# Patient Record
Sex: Male | Born: 1975 | Race: Black or African American | Hispanic: No | Marital: Married | Smoking: Former smoker
Health system: Southern US, Community
[De-identification: ages and names within clinical notes are randomized; demographics above are authoritative.]

## PROBLEM LIST (undated history)

## (undated) DIAGNOSIS — Z91030 Bee allergy status: Secondary | ICD-10-CM

## (undated) HISTORY — PX: HERNIA REPAIR: SHX51

## (undated) HISTORY — PX: ANTERIOR CRUCIATE LIGAMENT REPAIR: SHX115

---

## 1998-01-09 ENCOUNTER — Emergency Department (HOSPITAL_COMMUNITY): Admission: EM | Admit: 1998-01-09 | Discharge: 1998-01-09 | Payer: Self-pay | Admitting: Emergency Medicine

## 2004-08-03 ENCOUNTER — Emergency Department (HOSPITAL_COMMUNITY): Admission: EM | Admit: 2004-08-03 | Discharge: 2004-08-03 | Payer: Self-pay | Admitting: Family Medicine

## 2004-10-12 ENCOUNTER — Emergency Department (HOSPITAL_COMMUNITY): Admission: EM | Admit: 2004-10-12 | Discharge: 2004-10-12 | Payer: Self-pay | Admitting: Family Medicine

## 2004-10-18 ENCOUNTER — Emergency Department (HOSPITAL_COMMUNITY): Admission: EM | Admit: 2004-10-18 | Discharge: 2004-10-18 | Payer: Self-pay | Admitting: Family Medicine

## 2004-12-17 ENCOUNTER — Emergency Department (HOSPITAL_COMMUNITY): Admission: EM | Admit: 2004-12-17 | Discharge: 2004-12-17 | Payer: Self-pay | Admitting: Family Medicine

## 2004-12-20 ENCOUNTER — Emergency Department (HOSPITAL_COMMUNITY): Admission: EM | Admit: 2004-12-20 | Discharge: 2004-12-20 | Payer: Self-pay | Admitting: Family Medicine

## 2004-12-22 ENCOUNTER — Emergency Department (HOSPITAL_COMMUNITY): Admission: EM | Admit: 2004-12-22 | Discharge: 2004-12-22 | Payer: Self-pay | Admitting: Family Medicine

## 2005-02-23 ENCOUNTER — Emergency Department (HOSPITAL_COMMUNITY): Admission: EM | Admit: 2005-02-23 | Discharge: 2005-02-23 | Payer: Self-pay | Admitting: Family Medicine

## 2005-08-22 ENCOUNTER — Emergency Department (HOSPITAL_COMMUNITY): Admission: EM | Admit: 2005-08-22 | Discharge: 2005-08-22 | Payer: Self-pay | Admitting: Emergency Medicine

## 2007-08-09 ENCOUNTER — Emergency Department (HOSPITAL_COMMUNITY): Admission: EM | Admit: 2007-08-09 | Discharge: 2007-08-09 | Payer: Self-pay | Admitting: Emergency Medicine

## 2009-05-03 ENCOUNTER — Emergency Department (HOSPITAL_COMMUNITY): Admission: EM | Admit: 2009-05-03 | Discharge: 2009-05-03 | Payer: Self-pay | Admitting: Emergency Medicine

## 2009-08-31 ENCOUNTER — Emergency Department (HOSPITAL_COMMUNITY): Admission: EM | Admit: 2009-08-31 | Discharge: 2009-08-31 | Payer: Self-pay | Admitting: Emergency Medicine

## 2009-09-04 ENCOUNTER — Emergency Department (HOSPITAL_COMMUNITY): Admission: EM | Admit: 2009-09-04 | Discharge: 2009-09-04 | Payer: Self-pay | Admitting: Emergency Medicine

## 2010-03-20 ENCOUNTER — Inpatient Hospital Stay (HOSPITAL_COMMUNITY): Admission: EM | Admit: 2010-03-20 | Discharge: 2010-03-21 | Payer: Self-pay | Admitting: Emergency Medicine

## 2010-03-20 ENCOUNTER — Ambulatory Visit: Payer: Self-pay | Admitting: Pulmonary Disease

## 2010-04-12 ENCOUNTER — Emergency Department (HOSPITAL_COMMUNITY): Admission: EM | Admit: 2010-04-12 | Discharge: 2010-04-12 | Payer: Self-pay | Admitting: Emergency Medicine

## 2010-04-15 ENCOUNTER — Ambulatory Visit: Payer: Self-pay | Admitting: Internal Medicine

## 2010-04-15 DIAGNOSIS — T6391XA Toxic effect of contact with unspecified venomous animal, accidental (unintentional), initial encounter: Secondary | ICD-10-CM | POA: Insufficient documentation

## 2010-05-18 ENCOUNTER — Ambulatory Visit: Payer: Self-pay | Admitting: Internal Medicine

## 2010-10-26 NOTE — Assessment & Plan Note (Signed)
Summary: rov 1 month///kp   Primary Provider/Referring Provider:  none  CC:  1 month followup.  Pt c/o itching from previous yellow jacket stings. No other complaints today.Marland Kitchen  History of Present Illness:  History of Present Illness: April 15, 2010- 34 yoM referred by Dr Sung Amabile for allergy evaluation after insect sting. He has had occasional sting in the past withonly local reaction, and none i recent years. On 03/20/10 he ran his lawn mower over a [employer] and had at least 6 stings on his lower legs. Within 10 minutes legs were itching. he took a shower but itching progressed generally. On way to ER he passed out . He was intubated briefly at ER and treated with steroids, epinephrine and antihistamines. he cleared quickly and was discharged next day. There is hx of mild seasonal rhinitis. As a child he tended to get hives in Spring and Fall. He was also hospitalized as a young child with asthma, not needing intubation.   May 18, 2010- Insect sting allergy  Was followed by a yellow jacket but not stung again since last here. Carries epipen- never needed to use.  Hymenoptera panel IgE levels are all high.This was reviewed. We discussed prevention of exposure, pretreatment with antihistamines before going to areas like yardwork or picnics with potential exposure, and epipen. He carries his epipen. We discussed skin testing for stinging insect sensitivity and availability of venom hyposensitization, which I don't offer here. He feels quite well today.   Preventive Screening-Counseling & Management  Alcohol-Tobacco     Smoking Status: quit < 6 months     Smoking Cessation Counseling: yes     Smoke Cessation Stage: quit     Packs/Day: <0.25     Year Quit: 2011     Tobacco Counseling: to remain off tobacco products  Current Medications (verified): 1)  Epipen 0.3 Mg/0.76ml Devi (Epinephrine) .... For Severe Allergic Reaction  Allergies (verified): No Known Drug Allergies  Past  History:  Past Medical History: Last updated: 04/15/2010 stinging insect hypersensitivity- yellow jacket- 2011  Past Surgical History: Last updated: 04/15/2010 ACL knee surgery - 2002  Family History: Last updated: 04/15/2010 No significant allergy  Social History: Last updated: 04/15/2010 Married with children Exsmoker: 01/2010 1 PPW x 17 years Use of Pot: quit 01/2010 Works as IT sales professional  Risk Factors: Smoking Status: quit < 6 months (05/18/2010) Packs/Day: <0.25 (05/18/2010)  Social History: Smoking Status:  quit < 6 months Packs/Day:  <0.25  Review of Systems      See HPI  The patient denies anorexia, fever, weight loss, weight gain, vision loss, decreased hearing, hoarseness, chest pain, syncope, dyspnea on exertion, peripheral edema, prolonged cough, headaches, hemoptysis, abdominal pain, melena, hematochezia, and severe indigestion/heartburn.    Vital Signs:  Patient profile:   35 year old male Weight:      143 pounds O2 Sat:      98 % on Room air Pulse rate:   78 / minute BP sitting:   130 / 78  (left arm)  Vitals Entered By: Vernie Murders (May 18, 2010 4:03 PM)  O2 Flow:  Room air  Physical Exam  Additional Exam:  Not examined today. He looks well and comfortable. 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 I am recommending he see Dr Lucie Leather to discuss  venom testing and hyposensitization, which I don't offer here because of lack of volume. He is faithfull about carrying his Epipen. He knows measures to avoid exposure, and understands to pretreat with an antihistamine before potential exposure.  Other Orders: Est. Patient Level III (02725)  Patient Instructions: 1)  Please schedule a follow-up appointment as needed. 2)  i  would recommend that you see Dr Laurette Schimke to discuss insect venom testing. 3)  Avoid wearing colognes and scents outside, avoid light/ pastel colored clothing outside, and consider pretreating yourself with an antihistamine like allegra or claritin before outside activity like yardwork or picnics. Carry your Epipen.

## 2010-10-26 NOTE — Assessment & Plan Note (Signed)
Summary: allergies - stung by yellow jackets/apc   Vital Signs:  Patient profile:   35 year old male Weight:      141.38 pounds O2 Sat:      96 % on Room air Pulse rate:   74 / minute BP sitting:   126 / 84  (right arm) Cuff size:   regular  Vitals Entered By: Reynaldo Minium CMA (April 15, 2010 3:45 PM)  O2 Flow:  Room air CC: Allergy Consult-Dr. Simonds-stung by yellow jackets (multiple times).   Primary Provider/Referring Provider:  none  CC:  Allergy Consult-Dr. Simonds-stung by yellow jackets (multiple times).Marland Kitchen  History of Present Illness: April 15, 2010- 34 yoM referred by Dr Sung Amabile for allergy evaluation after insect sting. He has had occasional sting in the past withonly local reaction, and none i recent years. On 03/20/10 he ran his lawn mower over a [employer] and had at least 6 stings on his lower legs. Within 10 minutes legs were itching. he took a shower but itching progressed generally. On way to ER he passed out . He was intubated briefly at ER and treated with steroids, epinephrine and antihistamines. he cleared quickly and was discharged next day. There is hx of mild seasonal rhinitis. As a child he tended to get hives in Spring and Fall. He was also hospitalized as a Kiersten Coss child with asthma, not needing intubation.   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    Physical Exam  Additional Exam:  General: A/Ox3; pleasant and cooperative, NAD, trim, well- appearing SKIN: no rash, lesions NODES: no lymphadenopathy HEENT: [place]/AT, EOM- WNL, Conjuctivae- clear, PERRLA, TM-WNL, Nose- clear, Throat- clear and wnl, Mallampati  II NECK: Supple w/ fair ROM, JVD- none, normal carotid impulses w/o bruits Thyroid- normal to palpation CHEST: Clear to P&A HEART: RRR, no m/g/r heard ABDOMEN: Soft and nl; nml bowel sounds; no organomegaly or masses noted ZOX:WRUE, nl pulses, no edema  NEURO: Grossly intact to observation      Impression & Recommendations:  Problem # 1:  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 Offered referral for venom testing/ hyposensitization Discussed and trained Epipen, RX Anti histamine- eg allegra 180 on mowing days prophyllactically Hymenoptera allergy panel  Medications Added to Medication List This Visit: 1)  Epipen 0.3 Mg/0.25ml Devi (Epinephrine) .... For severe allergic reaction  Other Orders: Consultation Level III (45409) T- * Misc. Laboratory test [phone])  Patient Instructions: 1)  Please schedule a follow-up appointment in 1 month.  2)  Instruction and script for Epipen 3)  Consider a nonsedating otc antihistamine like Allegra 180 mg, or Loratadine. Take it at least an hour before you go out to mow or do yard work where you might get exposed to stinging insects. 4)  Lab Prescriptions: EPIPEN 0.3  MG/0.3ML DEVI (EPINEPHRINE) For severe allergic reaction  #1 x prn   Entered and Authorized by:   Waymon Budge MD   Signed by:   Waymon Budge MD on 04/15/2010   Method used:   Print then Give to Patient   RxID:   [id number]

## 2010-12-12 LAB — COMPREHENSIVE METABOLIC PANEL
ALT: 20 U/L (ref 0–53)
AST: 33 U/L (ref 0–37)
Albumin: 4 g/dL (ref 3.5–5.2)
Alkaline Phosphatase: 48 U/L (ref 39–117)
BUN: 7 mg/dL (ref 6–23)
CO2: 21 mEq/L (ref 19–32)
Calcium: 8.6 mg/dL (ref 8.4–10.5)
Chloride: 108 mEq/L (ref 96–112)
Creatinine, Ser: 1.36 mg/dL (ref 0.4–1.5)
GFR calc Af Amer: >60 mL/min (ref >60)
GFR calc non Af Amer: 60 mL/min — ABNORMAL LOW (ref >60)
Glucose, Bld: 126 mg/dL — ABNORMAL HIGH (ref 70–99)
Potassium: 3.5 mEq/L (ref 3.5–5.1)
Sodium: 139 mEq/L (ref 135–145)
Total Bilirubin: 1.2 mg/dL (ref 0.3–1.2)
Total Protein: 6.8 g/dL (ref 6.0–8.3)

## 2010-12-12 LAB — APTT: aPTT: 21 seconds — ABNORMAL LOW (ref 24–37)

## 2010-12-12 LAB — PROTIME-INR
INR: 1.07 (ref 0.00–1.49)
Prothrombin Time: 13.8 seconds (ref 11.6–15.2)

## 2010-12-12 LAB — LACTIC ACID, PLASMA: Lactic Acid, Venous: 5.7 mmol/L — ABNORMAL HIGH (ref 0.5–2.2)

## 2010-12-12 LAB — BASIC METABOLIC PANEL
BUN: 8 mg/dL (ref 6–23)
CO2: 26 mEq/L (ref 19–32)
Calcium: 8.7 mg/dL (ref 8.4–10.5)
Chloride: 106 mEq/L (ref 96–112)
Creatinine, Ser: 1.25 mg/dL (ref 0.4–1.5)
GFR calc Af Amer: >60 mL/min (ref >60)
GFR calc non Af Amer: >60 mL/min (ref >60)
Glucose, Bld: 133 mg/dL — ABNORMAL HIGH (ref 70–99)
Potassium: 4.2 mEq/L (ref 3.5–5.1)
Sodium: 139 mEq/L (ref 135–145)

## 2010-12-12 LAB — POCT I-STAT 3, ART BLOOD GAS (G3+)
Acid-Base Excess: 2 mmol/L (ref 0.0–2.0)
Bicarbonate: 26.9 mEq/L — ABNORMAL HIGH (ref 20.0–24.0)
O2 Saturation: 100 %
TCO2: 28 mmol/L (ref 0–100)
pCO2 arterial: 42.4 mmHg (ref 35.0–45.0)
pH, Arterial: 7.41 (ref 7.350–7.450)
pO2, Arterial: 609 mmHg — ABNORMAL HIGH (ref 80.0–100.0)

## 2010-12-12 LAB — DIFFERENTIAL
Basophils Absolute: 0 10*3/uL (ref 0.0–0.1)
Basophils Relative: 0 % (ref 0–1)
Eosinophils Absolute: 0 10*3/uL (ref 0.0–0.7)
Eosinophils Relative: 0 % (ref 0–5)
Lymphocytes Relative: 84 % — ABNORMAL HIGH (ref 12–46)
Lymphs Abs: 4.6 10*3/uL — ABNORMAL HIGH (ref 0.7–4.0)
Monocytes Absolute: 0.2 10*3/uL (ref 0.1–1.0)
Monocytes Relative: 4 % (ref 3–12)
Neutro Abs: 0.6 10*3/uL — ABNORMAL LOW (ref 1.7–7.7)
Neutrophils Relative %: 12 % — ABNORMAL LOW (ref 43–77)

## 2010-12-12 LAB — MRSA PCR SCREENING: MRSA by PCR: NEGATIVE

## 2010-12-12 LAB — PATHOLOGIST SMEAR REVIEW

## 2010-12-12 LAB — CK TOTAL AND CKMB (NOT AT ARMC)
CK, MB: 0.9 ng/mL (ref 0.3–4.0)
Relative Index: 0.5 (ref 0.0–2.5)
Total CK: 173 U/L (ref 7–232)

## 2010-12-12 LAB — CBC
HCT: 45 % (ref 39.0–52.0)
Hemoglobin: 15 g/dL (ref 13.0–17.0)
MCH: 29 pg (ref 26.0–34.0)
MCHC: 33.4 g/dL (ref 30.0–36.0)
MCV: 86.8 fL (ref 78.0–100.0)
Platelets: 234 10*3/uL (ref 150–400)
RBC: 5.18 MIL/uL (ref 4.22–5.81)
RDW: 13.7 % (ref 11.5–15.5)
WBC: 5.4 10*3/uL (ref 4.0–10.5)

## 2010-12-12 LAB — TYPE AND SCREEN
ABO/RH(D): A POS
Antibody Screen: NEGATIVE

## 2010-12-12 LAB — TROPONIN I: Troponin I: <0.01 ng/mL (ref 0.00–0.06)

## 2010-12-12 LAB — ABO/RH: ABO/RH(D): A POS

## 2010-12-12 LAB — GLUCOSE, CAPILLARY: Glucose-Capillary: 105 mg/dL — ABNORMAL HIGH (ref 70–99)

## 2011-01-01 LAB — URINALYSIS, ROUTINE W REFLEX MICROSCOPIC
Bilirubin Urine: NEGATIVE
Glucose, UA: NEGATIVE mg/dL
Ketones, ur: NEGATIVE mg/dL
Nitrite: NEGATIVE
Protein, ur: NEGATIVE mg/dL
Specific Gravity, Urine: 1.028 (ref 1.005–1.030)
Urobilinogen, UA: 1 mg/dL (ref 0.0–1.0)
pH: 7 (ref 5.0–8.0)

## 2011-01-01 LAB — URINE MICROSCOPIC-ADD ON

## 2011-01-01 LAB — GC/CHLAMYDIA PROBE AMP, GENITAL
Chlamydia, DNA Probe: NEGATIVE
GC Probe Amp, Genital: NEGATIVE

## 2012-05-12 ENCOUNTER — Encounter (HOSPITAL_COMMUNITY): Payer: Self-pay | Admitting: Emergency Medicine

## 2012-05-12 ENCOUNTER — Emergency Department (HOSPITAL_COMMUNITY)
Admission: EM | Admit: 2012-05-12 | Discharge: 2012-05-12 | Disposition: A | Discharge status: Home / Self Care | Payer: Self-pay | Attending: Emergency Medicine | Admitting: Emergency Medicine

## 2012-05-12 ENCOUNTER — Emergency Department (HOSPITAL_COMMUNITY): Payer: Self-pay

## 2012-05-12 DIAGNOSIS — F17200 Nicotine dependence, unspecified, uncomplicated: Secondary | ICD-10-CM | POA: Insufficient documentation

## 2012-05-12 DIAGNOSIS — S92919A Unspecified fracture of unspecified toe(s), initial encounter for closed fracture: Secondary | ICD-10-CM | POA: Insufficient documentation

## 2012-05-12 DIAGNOSIS — Y93E8 Activity, other personal hygiene: Secondary | ICD-10-CM | POA: Insufficient documentation

## 2012-05-12 DIAGNOSIS — W230XXA Caught, crushed, jammed, or pinched between moving objects, initial encounter: Secondary | ICD-10-CM | POA: Insufficient documentation

## 2012-05-12 HISTORY — DX: Bee allergy status: Z91.030

## 2012-05-12 MED ORDER — OXYCODONE-ACETAMINOPHEN 5-325 MG PO TABS
1.0000 | ORAL_TABLET | Freq: Once | ORAL | Status: AC
  Administered 2012-05-12: 1 via ORAL
  Filled 2012-05-12: qty 1

## 2012-05-12 MED ORDER — PERCOCET 5-325 MG PO TABS: 1.0000 | ORAL_TABLET | Freq: Four times a day (QID) | ORAL | Status: AC | PRN

## 2012-05-12 NOTE — ED Notes (Signed)
Swollen 5th toe on l/foot

## 2012-05-12 NOTE — ED Notes (Signed)
Pt given ice bag for toe.

## 2012-05-12 NOTE — ED Provider Notes (Signed)
History     CSN: [id number]  Arrival date & time 05/12/12  8295   First MD Initiated Contact with Patient 05/12/12 [phone]      Chief Complaint  Patient presents with  . Toe Injury    5 th toe l/foot swollen    (Consider location/radiation/quality/duration/timing/severity/associated sxs/prior treatment) HPI  Pt was putting on his athletic shower shoes last night about 11 pm and he got his left little toe caught outside the flap. He reports pain and swelling since then. He denies any other injury.  PCP none  Past Medical History  Diagnosis Date  . Bee sting allergy     Past Surgical History  Procedure Date  . Anterior cruciate ligament repair     Family History  Problem Relation Age of Onset  . Hypertension Mother     History  Substance Use Topics  . Smoking status: Current Everyday Smoker    Types: Cigarettes  . Smokeless tobacco: Not on file  . Alcohol Use: Yes   employed   Review of Systems  All other systems reviewed and are negative.    Allergies  Bee pollen  Home Medications   Current Outpatient Rx  Name Route Sig Dispense Refill  . EPINEPHRINE 0.15 MG/0.3ML IJ DEVI Intramuscular Inject 0.15 mg into the muscle as needed.      BP 131/84  Pulse 74  Temp 98.6 F (37 C) (Oral)  Resp 18  SpO2 100%  Vital signs normal     Physical Exam  Nursing note and vitals reviewed. Constitutional: He is oriented to person, place, and time. He appears well-developed and well-nourished.  Non-toxic appearance. He does not appear ill. No distress.  HENT:  Head: Normocephalic and atraumatic.  Nose: No mucosal edema or rhinorrhea.  Mouth/Throat: Mucous membranes are normal. No dental abscesses or uvula swelling.  Eyes: Conjunctivae and EOM are normal. Pupils are equal, round, and reactive to light.  Neck: Normal range of motion and full passive range of motion without pain. Neck supple.  Cardiovascular: Exam reveals friction rub.   Pulmonary/Chest: Effort  normal. No respiratory distress. He has no rhonchi. He exhibits no crepitus.  Abdominal: Normal appearance.  Musculoskeletal: Normal range of motion. He exhibits edema and tenderness.       Moves all extremities well.   Patient noted to have diffuse swelling of his left little toe with swelling over the MTP joint of the same toe. He is nontender to palpation in his ankle or his proximal foot. He has good distal pulses. He's tender in the areas that are swollen.  Neurological: He is alert and oriented to person, place, and time. He has normal strength. No cranial nerve deficit.  Skin: Skin is warm, dry and intact. No rash noted. No erythema. No pallor.  Psychiatric: He has a normal mood and affect. His speech is normal and behavior is normal. His mood appears not anxious.    ED Course  Procedures (including critical care time)   Medications  oxyCODONE-acetaminophen (PERCOCET/ROXICET) 5-325 MG per tablet 1 tablet (not administered)   Pt given an ice pack and placed in post-op shoe after having toes buddy taped.    Dg Foot Complete Left  05/12/2012  *RADIOLOGY REPORT*  Clinical Data: Injury and lateral pain.  LEFT FOOT - COMPLETE 3+ VIEW  Comparison: None.  Findings: Three views of the left foot were obtained.  There is a mildly displaced fracture of the fifth toe proximal phalanx.  The fracture involves the distal aspect  of the proximal phalanx. Fracture is displaced laterally.  The fracture appears to be mildly comminuted and may extend to the PIP joint.  There are no other definite fractures.  IMPRESSION: Fracture involving the fifth toe proximal phalanx.  Original Report Authenticated By: Richarda Overlie, M.D.     1. Fracture, toe     New Prescriptions   PERCOCET 5-325 MG PER TABLET    Take 1 tablet by mouth every 6 (six) hours as needed for pain.    Plan discharge  Devoria Albe, MD, FACEP   MDM          Ward Givens, MD 05/12/12 1030

## 2013-07-05 ENCOUNTER — Emergency Department (INDEPENDENT_AMBULATORY_CARE_PROVIDER_SITE_OTHER)
Admission: EM | Admit: 2013-07-05 | Discharge: 2013-07-05 | Disposition: A | Discharge status: Home / Self Care | Payer: Self-pay | Source: Home / Self Care | Attending: Family Medicine | Admitting: Family Medicine

## 2013-07-05 ENCOUNTER — Encounter (HOSPITAL_COMMUNITY): Payer: Self-pay | Admitting: Emergency Medicine

## 2013-07-05 DIAGNOSIS — J019 Acute sinusitis, unspecified: Secondary | ICD-10-CM

## 2013-07-05 MED ORDER — FLUTICASONE PROPIONATE 50 MCG/ACT NA SUSP: 2.0000 | Freq: Every day | NASAL | Status: DC

## 2013-07-05 MED ORDER — AMOXICILLIN-POT CLAVULANATE 875-125 MG PO TABS: 1.0000 | ORAL_TABLET | Freq: Two times a day (BID) | ORAL | Status: DC

## 2013-07-05 MED ORDER — PSEUDOEPHEDRINE-IBUPROFEN 30-200 MG PO TABS: ORAL_TABLET | ORAL | Status: DC

## 2013-07-05 MED ORDER — METHYLPREDNISOLONE 4 MG PO KIT: PACK | ORAL | Status: DC

## 2013-07-05 NOTE — ED Notes (Signed)
C/o sinus pressure and pain. Post nasal drip. And sore throat.  Denies fever, n/v/d  Onset 2 days ago.  Pt has been using benadryl with no relief.

## 2013-07-05 NOTE — ED Provider Notes (Signed)
CSN: [id number]     Arrival date & time 07/05/13  1105 History   First MD Initiated Contact with Patient 07/05/13 1128     Chief Complaint  Patient presents with  . Facial Pain    sinus pressure and pain. post nasal drip. sore throat   (Consider location/radiation/quality/duration/timing/severity/associated sxs/prior Treatment) HPI Comments: 37 year old male presents complaining of sinus pressure and pain with postnasal drip and scratchy sore throat for the past 3 days. This began with a scratchy throat and nasal congestion in the sinus pressure and pain have developed since then. The pressure is constant and is causing him to have a headache around that area. He has tried taking Benadryl for this but it did not help. Denies fever, chills, NVD, or history of sinus infections   Past Medical History  Diagnosis Date  . Bee sting allergy    Past Surgical History  Procedure Laterality Date  . Anterior cruciate ligament repair     Family History  Problem Relation Age of Onset  . Hypertension Mother    History  Substance Use Topics  . Smoking status: Current Every Day Smoker    Types: Cigarettes  . Smokeless tobacco: Not on file  . Alcohol Use: Yes    Review of Systems  Constitutional: Negative for fever, chills and fatigue.  HENT: Positive for congestion, postnasal drip, sinus pressure and sore throat. Negative for ear discharge, ear pain, facial swelling, nosebleeds and trouble swallowing.   Eyes: Negative for visual disturbance.  Respiratory: Negative for cough and shortness of breath.   Cardiovascular: Negative for chest pain, palpitations and leg swelling.  Gastrointestinal: Negative for nausea, vomiting, abdominal pain, diarrhea and constipation.  Genitourinary: Negative for dysuria, urgency, frequency and hematuria.  Musculoskeletal: Negative for arthralgias, myalgias, neck pain and neck stiffness.  Skin: Negative for rash.  Neurological: Negative for dizziness, weakness  and light-headedness.    Allergies  Bee pollen  Home Medications   Current Outpatient Rx  Name  Route  Sig  Dispense  Refill  . amoxicillin-clavulanate (AUGMENTIN) 875-125 MG per tablet   Oral   Take 1 tablet by mouth every 12 (twelve) hours.   14 tablet   0   . EPINEPHrine (EPIPEN JR) 0.15 MG/0.3ML injection   Intramuscular   Inject 0.15 mg into the muscle as needed.         . fluticasone (FLONASE) 50 MCG/ACT nasal spray   Nasal   Place 2 sprays into the nose daily.   1 g   2   . methylPREDNISolone (MEDROL DOSEPAK) 4 MG tablet      follow package directions   21 tablet   0     Dispense as written.   . Pseudoephedrine-Ibuprofen (ADVIL COLD/SINUS) 30-200 MG TABS      2 tabs PO Q6 hrs PRN   30 each   2    BP 104/72  Pulse 69  Temp(Src) 98.7 F (37.1 C) (Oral)  SpO2 97% Physical Exam  Nursing note and vitals reviewed. Constitutional: He is oriented to person, place, and time. He appears well-developed and well-nourished. No distress.  HENT:  Head: Normocephalic.  Right Ear: Hearing, tympanic membrane, external ear and ear canal normal.  Left Ear: Hearing, tympanic membrane, external ear and ear canal normal.  Nose: Right sinus exhibits maxillary sinus tenderness and frontal sinus tenderness. Left sinus exhibits maxillary sinus tenderness and frontal sinus tenderness.  Pulmonary/Chest: Effort normal. No respiratory distress.  Lymphadenopathy:       Head (right  side): Submandibular and tonsillar adenopathy present.       Head (left side): Submandibular and tonsillar adenopathy present.    He has cervical adenopathy.       Right cervical: Posterior cervical adenopathy present.       Left cervical: Posterior cervical adenopathy present.  Neurological: He is alert and oriented to person, place, and time. Coordination normal.  Skin: Skin is warm and dry. No rash noted. He is not diaphoretic.  Psychiatric: He has a normal mood and affect. Judgment normal.     ED Course  Procedures (including critical care time) Labs Review Labs Reviewed - No data to display Imaging Review No results found.    MDM   1. Sinusitis, acute    Treat with anti-inflammatories and symptomatically for now. Giving a postdated prescription for Augmentin, he will wait to fill it if he is worsening or not improving after 5 days. Followup when necessary  Meds ordered this encounter  Medications  . methylPREDNISolone (MEDROL DOSEPAK) 4 MG tablet    Sig: follow package directions    Dispense:  21 tablet    Refill:  0    Order Specific Question:  Supervising Provider    Answer:  Lorenz Coaster, DAVID C [id number]  . fluticasone (FLONASE) 50 MCG/ACT nasal spray    Sig: Place 2 sprays into the nose daily.    Dispense:  1 g    Refill:  2    Order Specific Question:  Supervising Provider    Answer:  Lorenz Coaster, DAVID C [id number]  . Pseudoephedrine-Ibuprofen (ADVIL COLD/SINUS) 30-200 MG TABS    Sig: 2 tabs PO Q6 hrs PRN    Dispense:  30 each    Refill:  2    Order Specific Question:  Supervising Provider    Answer:  Lorenz Coaster, DAVID C [id number]  . amoxicillin-clavulanate (AUGMENTIN) 875-125 MG per tablet    Sig: Take 1 tablet by mouth every 12 (twelve) hours.    Dispense:  14 tablet    Refill:  0    Order Specific Question:  Supervising Provider    Answer:  Lorenz Coaster, DAVID C [[id number]]       Graylon Good, PA-C 07/05/13 1224

## 2013-07-09 NOTE — ED Provider Notes (Signed)
Medical screening examination/treatment/procedure(s) were performed by a resident physician or non-physician practitioner and as the supervising physician I was immediately available for consultation/collaboration.  Clementeen Graham, MD    Rodolph Bong, MD 07/09/13 [phone]

## 2013-11-27 ENCOUNTER — Encounter (HOSPITAL_COMMUNITY): Payer: Self-pay | Admitting: Emergency Medicine

## 2013-11-27 ENCOUNTER — Emergency Department (HOSPITAL_COMMUNITY)
Admission: EM | Admit: 2013-11-27 | Discharge: 2013-11-27 | Disposition: A | Discharge status: Home / Self Care | Payer: Medicaid Other | Attending: Emergency Medicine | Admitting: Emergency Medicine

## 2013-11-27 DIAGNOSIS — F17200 Nicotine dependence, unspecified, uncomplicated: Secondary | ICD-10-CM | POA: Insufficient documentation

## 2013-11-27 DIAGNOSIS — J029 Acute pharyngitis, unspecified: Secondary | ICD-10-CM | POA: Insufficient documentation

## 2013-11-27 DIAGNOSIS — IMO0002 Reserved for concepts with insufficient information to code with codable children: Secondary | ICD-10-CM | POA: Insufficient documentation

## 2013-11-27 DIAGNOSIS — R630 Anorexia: Secondary | ICD-10-CM | POA: Insufficient documentation

## 2013-11-27 LAB — RAPID STREP SCREEN (MED CTR MEBANE ONLY): Streptococcus, Group A Screen (Direct): NEGATIVE

## 2013-11-27 MED ORDER — PSEUDOEPHEDRINE-IBUPROFEN 30-200 MG PO TABS: ORAL_TABLET | ORAL | Status: DC

## 2013-11-27 NOTE — Discharge Instructions (Signed)
Pharyngitis °Pharyngitis is redness, pain, and swelling (inflammation) of your pharynx.  °CAUSES  °Pharyngitis is usually caused by infection. Most of the time, these infections are from viruses (viral) and are part of a cold. However, sometimes pharyngitis is caused by bacteria (bacterial). Pharyngitis can also be caused by allergies. Viral pharyngitis may be spread from person to person by coughing, sneezing, and personal items or utensils (cups, forks, spoons, toothbrushes). Bacterial pharyngitis may be spread from person to person by more intimate contact, such as kissing.  °SIGNS AND SYMPTOMS  °Symptoms of pharyngitis include:   °· Sore throat.   °· Tiredness (fatigue).   °· Low-grade fever.   °· Headache. °· Joint pain and muscle aches. °· Skin rashes. °· Swollen lymph nodes. °· Plaque-like film on throat or tonsils (often seen with bacterial pharyngitis). °DIAGNOSIS  °Your health care provider will ask you questions about your illness and your symptoms. Your medical history, along with a physical exam, is often all that is needed to diagnose pharyngitis. Sometimes, a rapid strep test is done. Other lab tests may also be done, depending on the suspected cause.  °TREATMENT  °Viral pharyngitis will usually get better in 3 4 days without the use of medicine. Bacterial pharyngitis is treated with medicines that kill germs (antibiotics).  °HOME CARE INSTRUCTIONS  °· Drink enough water and fluids to keep your urine clear or pale yellow.   °· Only take over-the-counter or prescription medicines as directed by your health care provider:   °· If you are prescribed antibiotics, make sure you finish them even if you start to feel better.   °· Do not take aspirin.   °· Get lots of rest.   °· Gargle with 8 oz of salt water (½ tsp of salt per 1 qt of water) as often as every 1 2 hours to soothe your throat.   °· Throat lozenges (if you are not at risk for choking) or sprays may be used to soothe your throat. °SEEK MEDICAL  CARE IF:  °· You have large, tender lumps in your neck. °· You have a rash. °· You cough up green, yellow-brown, or bloody spit. °SEEK IMMEDIATE MEDICAL CARE IF:  °· Your neck becomes stiff. °· You drool or are unable to swallow liquids. °· You vomit or are unable to keep medicines or liquids down. °· You have severe pain that does not go away with the use of recommended medicines. °· You have trouble breathing (not caused by a stuffy nose). °MAKE SURE YOU:  °· Understand these instructions. °· Will watch your condition. °· Will get help right away if you are not doing well or get worse. °Document Released: 09/12/2005 Document Revised: 07/03/2013 Document Reviewed: 05/20/2013 °[employer]® Patient Information ©2014 [employer]. °Viral Infections °A virus is a type of germ. Viruses can cause: °· Minor sore throats. °· Aches and pains. °· Headaches. °· Runny nose. °· Rashes. °· Watery eyes. °· Tiredness. °· Coughs. °· Loss of appetite. °· Feeling sick to your stomach (nausea). °· Throwing up (vomiting). °· Watery poop (diarrhea). °HOME CARE  °· Only take medicines as told by your doctor. °· Drink enough water and fluids to keep your pee (urine) clear or pale yellow. Sports drinks are a good choice. °· Get plenty of rest and eat healthy. Soups and broths with crackers or rice are fine. °GET HELP RIGHT AWAY IF:  °· You have a very bad headache. °· You have shortness of breath. °· You have chest pain or neck pain. °· You have an unusual rash. °·   You cannot stop throwing up.  You have watery poop that does not stop.  You cannot keep fluids down.  You or your child has a temperature by mouth above 102 F (38.9 C), not controlled by medicine.  Your baby is older than 3 months with a rectal temperature of 102 F (38.9 C) or higher.  Your baby is 643 months old or younger with a rectal temperature of 100.4 F (38 C) or higher. MAKE SURE YOU:   Understand these instructions.  Will watch this condition.  Will  get help right away if you are not doing well or get worse. Document Released: 08/25/2008 Document Revised: 12/05/2011 Document Reviewed: 01/18/2011 [facility] Patient Information 2014 [place], [place].

## 2013-11-27 NOTE — ED Notes (Signed)
Pt states he's had sore throat and fever since Saturday.

## 2013-11-27 NOTE — ED Provider Notes (Signed)
Medical screening examination/treatment/procedure(s) were performed by non-physician practitioner and as supervising physician I was immediately available for consultation/collaboration.   EKG Interpretation None      Devoria AlbeIva Zaakirah Kistner, MD, Armando GangFACEP   Ward GivensIva L Oliana Gowens, MD 11/27/13 [phone]

## 2013-11-27 NOTE — ED Provider Notes (Signed)
CSN: [id number]     Arrival date & time 11/27/13  1130 History  This chart was scribed for non-physician practitioner Teressa Lower, NP working with Ward Givens, MD by Dorothey Baseman, ED Scribe. This patient was seen in room TR08C/TR08C and the patient's care was started at 12:14 PM.    Chief Complaint  Patient presents with  . Sore Throat  . Fever   The history is provided by the patient. No language interpreter was used.   HPI Comments: Jose Rangel is a 38 y.o. male who presents to the Emergency Department complaining of a constant sore throat with associated congestion, diffuse myalgias, decreased appetite, and intermittent fever (100.3 measured in the ED) onset 5 days ago. He reports some associated chills that are worse at night. Patient reports taking Advil Sinus and Cold at home with mild, temporary relief. He reports an allergy to bee venom, but denies any allergies to medications. Patient has no other pertinent medical history.   Past Medical History  Diagnosis Date  . Bee sting allergy    Past Surgical History  Procedure Laterality Date  . Anterior cruciate ligament repair     Family History  Problem Relation Age of Onset  . Hypertension Mother    History  Substance Use Topics  . Smoking status: Current Every Day Smoker    Types: Cigarettes  . Smokeless tobacco: Not on file  . Alcohol Use: Yes    Review of Systems  Constitutional: Positive for fever, chills and appetite change.  HENT: Positive for congestion and sore throat.   Musculoskeletal: Positive for myalgias.  All other systems reviewed and are negative.   Allergies  Bee pollen  Home Medications   Current Outpatient Rx  Name  Route  Sig  Dispense  Refill  . amoxicillin-clavulanate (AUGMENTIN) 875-125 MG per tablet   Oral   Take 1 tablet by mouth every 12 (twelve) hours.   14 tablet   0   . EPINEPHrine (EPIPEN JR) 0.15 MG/0.3ML injection   Intramuscular   Inject 0.15 mg into the muscle as  needed.         . fluticasone (FLONASE) 50 MCG/ACT nasal spray   Nasal   Place 2 sprays into the nose daily.   1 g   2   . methylPREDNISolone (MEDROL DOSEPAK) 4 MG tablet      follow package directions   21 tablet   0     Dispense as written.   . Pseudoephedrine-Ibuprofen (ADVIL COLD/SINUS) 30-200 MG TABS      2 tabs PO Q6 hrs PRN   30 each   2    Triage Vitals: BP 127/81  Pulse 84  Temp(Src) 100.3 F (37.9 C) (Oral)  SpO2 94%  Physical Exam  Nursing note and vitals reviewed. Constitutional: He is oriented to person, place, and time. He appears well-developed and well-nourished. No distress.  HENT:  Head: Normocephalic and atraumatic.  Mouth/Throat: No oropharyngeal exudate.  Mild pharyngeal erythema.   Eyes: Conjunctivae are normal.  Neck: Normal range of motion. Neck supple.  Cardiovascular: Normal rate, regular rhythm and normal heart sounds.   Pulmonary/Chest: Effort normal and breath sounds normal. No respiratory distress.  Abdominal: He exhibits no distension.  Musculoskeletal: Normal range of motion.  Neurological: He is alert and oriented to person, place, and time.  Skin: Skin is warm and dry.  Psychiatric: He has a normal mood and affect. His behavior is normal.    ED Course  Procedures (including  critical care time)  DIAGNOSTIC STUDIES: Oxygen Saturation is 94% on room air, adequate by my interpretation.    COORDINATION OF CARE: 12:16 PM- Ordered a rapid strep test. Advised of symptomatic care. Discussed treatment plan with patient at bedside and patient verbalized agreement.     Labs Review Labs Reviewed  RAPID STREP SCREEN  CULTURE, GROUP A STREP   Imaging Review No results found.   EKG Interpretation None      MDM   Final diagnoses:  Pharyngitis    Will treat symptomatically  I personally performed the services described in this documentation, which was scribed in my presence. The recorded information has been reviewed and  is accurate.     Teressa LowerVrinda Mahesh Sizemore, NP 11/27/13 1224

## 2013-11-27 NOTE — ED Notes (Signed)
Pt ambulates without distress. Respirations easy non labored. 

## 2013-11-29 LAB — CULTURE, GROUP A STREP

## 2015-05-15 ENCOUNTER — Encounter (HOSPITAL_COMMUNITY): Payer: Self-pay | Admitting: Emergency Medicine

## 2015-05-15 ENCOUNTER — Emergency Department (HOSPITAL_COMMUNITY)
Admission: EM | Admit: 2015-05-15 | Discharge: 2015-05-15 | Disposition: A | Discharge status: Home / Self Care | Payer: Medicaid Other | Attending: Emergency Medicine | Admitting: Emergency Medicine

## 2015-05-15 DIAGNOSIS — Z792 Long term (current) use of antibiotics: Secondary | ICD-10-CM | POA: Insufficient documentation

## 2015-05-15 DIAGNOSIS — Y9289 Other specified places as the place of occurrence of the external cause: Secondary | ICD-10-CM | POA: Insufficient documentation

## 2015-05-15 DIAGNOSIS — Z87892 Personal history of anaphylaxis: Secondary | ICD-10-CM | POA: Insufficient documentation

## 2015-05-15 DIAGNOSIS — Y998 Other external cause status: Secondary | ICD-10-CM | POA: Diagnosis not present

## 2015-05-15 DIAGNOSIS — R0602 Shortness of breath: Secondary | ICD-10-CM | POA: Insufficient documentation

## 2015-05-15 DIAGNOSIS — Z720 Tobacco use: Secondary | ICD-10-CM | POA: Diagnosis not present

## 2015-05-15 DIAGNOSIS — T63441A Toxic effect of venom of bees, accidental (unintentional), initial encounter: Secondary | ICD-10-CM | POA: Insufficient documentation

## 2015-05-15 DIAGNOSIS — Z79899 Other long term (current) drug therapy: Secondary | ICD-10-CM | POA: Diagnosis not present

## 2015-05-15 DIAGNOSIS — R079 Chest pain, unspecified: Secondary | ICD-10-CM | POA: Insufficient documentation

## 2015-05-15 DIAGNOSIS — Y939 Activity, unspecified: Secondary | ICD-10-CM | POA: Diagnosis not present

## 2015-05-15 DIAGNOSIS — Z23 Encounter for immunization: Secondary | ICD-10-CM | POA: Diagnosis not present

## 2015-05-15 DIAGNOSIS — Z91038 Other insect allergy status: Secondary | ICD-10-CM

## 2015-05-15 MED ORDER — DIPHENHYDRAMINE HCL 25 MG PO TABS: 50.0000 mg | ORAL_TABLET | Freq: Four times a day (QID) | ORAL | Status: DC | PRN

## 2015-05-15 MED ORDER — PREDNISONE 20 MG PO TABS: ORAL_TABLET | ORAL | Status: DC

## 2015-05-15 MED ORDER — TETANUS-DIPHTH-ACELL PERTUSSIS 5-2.5-18.5 LF-MCG/0.5 IM SUSP
0.5000 mL | Freq: Once | INTRAMUSCULAR | Status: AC
  Administered 2015-05-15: 0.5 mL via INTRAMUSCULAR
  Filled 2015-05-15: qty 0.5

## 2015-05-15 MED ORDER — DIPHENHYDRAMINE HCL 50 MG/ML IJ SOLN
25.0000 mg | Freq: Once | INTRAMUSCULAR | Status: AC
  Administered 2015-05-15: 25 mg via INTRAVENOUS
  Filled 2015-05-15: qty 1

## 2015-05-15 MED ORDER — FAMOTIDINE IN NACL 20-0.9 MG/50ML-% IV SOLN
20.0000 mg | Freq: Once | INTRAVENOUS | Status: AC
  Administered 2015-05-15: 20 mg via INTRAVENOUS
  Filled 2015-05-15: qty 50

## 2015-05-15 MED ORDER — RANITIDINE HCL 150 MG PO TABS: 150.0000 mg | ORAL_TABLET | Freq: Two times a day (BID) | ORAL | Status: DC

## 2015-05-15 MED ORDER — EPINEPHRINE 0.3 MG/0.3ML IJ SOAJ: 0.3000 mg | INTRAMUSCULAR | Status: DC | PRN

## 2015-05-15 MED ORDER — DEXAMETHASONE SODIUM PHOSPHATE 10 MG/ML IJ SOLN
10.0000 mg | Freq: Once | INTRAMUSCULAR | Status: AC
  Administered 2015-05-15: 10 mg via INTRAVENOUS
  Filled 2015-05-15: qty 1

## 2015-05-15 NOTE — ED Notes (Signed)
PA Mercedes at bedside with patient.

## 2015-05-15 NOTE — ED Provider Notes (Signed)
CSN: [id number]     Arrival date & time 05/15/15  4098 History   First MD Initiated Contact with Patient 05/15/15 0755     Chief Complaint  Patient presents with  . Insect Bite    bee sting     (Consider location/radiation/quality/duration/timing/severity/associated sxs/prior Treatment) HPI Comments: Jose Rangel is a 39 y.o. male with a PMHx of allergies to bees, who presents to the ED with complaints of bee sting approximately 30 minutes prior to arrival. He reports that he was pumping gas at the [place] patient and felt something sting the left side of his neck. He finished pumping is gas and got back into his car, feeling some chest tightness and mildly short of breath, took 2 Benadryl which improved his symptoms. He no longer feels short of breath. He reports some mild itching at the site of the insect bite. He denies any facial/tongue/lip swelling, pain, wheezing, fevers, chills, chest pain, drooling, difficulty swallowing, abdominal pain, nausea, vomiting, numbness, tingling, or weakness. He is unsure of his last tetanus.  He reports a remote history of requiring intubation after a bee sting in June 2011. Chart review reveals that the patient presented shortly after a bee sting, in route he developed acute shortness of breath and decreased level of consciousness, upon arrival he was intubated, given Decadron, Benadryl, Pepcid, and epinephrine which improved his symptoms dramatically, and he was subsequently extubated in the ER. He was observed overnight with no further issues. The discharge summary from PCCM docs questions whether this was truly anaphylaxis, but still sent him for allergy testing and he was given an Epipen. He states his EpiPen has expired therefore he did not use it prior to arrival.  Patient is a 39 y.o. male presenting with animal bite. The history is provided by the patient. No language interpreter was used.  Animal Bite Contact animal:  Insect Location:   Head/neck Head/neck injury location:  Neck Time since incident:  30 minutes Pain details:    Quality:  Itching   Severity:  No pain   Timing:  Constant   Progression:  Improving Incident location:  Outside Provoked: unprovoked   Notifications:  None Animal in possession: no   Tetanus status:  Unknown Relieved by:  OTC medications Worsened by:  Nothing tried Ineffective treatments:  None tried Associated symptoms: no fever, no numbness, no rash and no swelling     Past Medical History  Diagnosis Date  . Bee sting allergy    Past Surgical History  Procedure Laterality Date  . Anterior cruciate ligament repair     Family History  Problem Relation Age of Onset  . Hypertension Mother    Social History  Substance Use Topics  . Smoking status: Current Every Day Smoker    Types: Cigarettes  . Smokeless tobacco: Not on file  . Alcohol Use: Yes    Review of Systems  Constitutional: Negative for fever and chills.  HENT: Negative for drooling, facial swelling and trouble swallowing.   Respiratory: Positive for chest tightness and shortness of breath (now resolved). Negative for wheezing.   Cardiovascular: Negative for chest pain.  Gastrointestinal: Negative for nausea, vomiting and abdominal pain.  Musculoskeletal: Negative for myalgias and arthralgias.  Skin: Positive for color change (erythema to insect bite) and wound (insect bite). Negative for rash.  Allergic/Immunologic: Positive for environmental allergies (bees). Negative for immunocompromised state.  Neurological: Negative for syncope, weakness and numbness.  Psychiatric/Behavioral: Negative for confusion.   10 Systems reviewed and are  negative for acute change except as noted in the HPI.    Allergies  Bee pollen  Home Medications   Prior to Admission medications   Medication Sig Start Date End Date Taking? Authorizing Provider  amoxicillin-clavulanate (AUGMENTIN) 875-125 MG per tablet Take 1 tablet by mouth  every 12 (twelve) hours. 07/05/13   Adrian Blackwater Baker, PA-C  EPINEPHrine (EPIPEN JR) 0.15 MG/0.3ML injection Inject 0.15 mg into the muscle as needed.    Historical Provider, MD  fluticasone (FLONASE) 50 MCG/ACT nasal spray Place 2 sprays into the nose daily. 07/05/13   Graylon Good, PA-C  methylPREDNISolone (MEDROL DOSEPAK) 4 MG tablet follow package directions 07/05/13   Graylon Good, PA-C  Pseudoephedrine-Ibuprofen (ADVIL COLD/SINUS) 30-200 MG TABS 2 tabs PO Q6 hrs PRN 11/27/13   Teressa Lower, NP   BP 152/100 mmHg  Pulse 75  Temp(Src) 98.3 F (36.8 C) (Oral)  Resp 17  Ht 5\' 8"  (1.727 m)  Wt 143 lb (64.864 kg)  BMI 21.75 kg/m2  SpO2 100% Physical Exam  Constitutional: He is oriented to person, place, and time. Vital signs are normal. He appears well-developed and well-nourished.  Non-toxic appearance. No distress.  Afebrile, nontoxic, NAD. Triage BP 152/100 but during exam BP 120/82  HENT:  Head: Normocephalic and atraumatic.  Nose: Nose normal.  Mouth/Throat: Uvula is midline, oropharynx is clear and moist and mucous membranes are normal. No trismus in the jaw. No uvula swelling.  Nose clear. Oropharynx clear and moist, without uvular swelling or deviation, no trismus or drooling, no tonsillar swelling or erythema, no exudates.  Patent airway  Eyes: Conjunctivae and EOM are normal. Right eye exhibits no discharge. Left eye exhibits no discharge.  Neck: Normal range of motion and phonation normal. Neck supple.  Phonation normal, no stridor  Cardiovascular: Normal rate, regular rhythm, normal heart sounds and intact distal pulses.  Exam reveals no gallop and no friction rub.   No murmur heard. RRR, nl s1/s2, no m/r/g, distal pulses intact  Pulmonary/Chest: Effort normal and breath sounds normal. No stridor. No respiratory distress. He has no decreased breath sounds. He has no wheezes. He has no rhonchi. He has no rales.  CTAB in all lung fields, no w/r/r, no hypoxia or increased  WOB, speaking in full sentences, SpO2 100% on RA   Abdominal: Soft. Normal appearance and bowel sounds are normal. He exhibits no distension. There is no tenderness. There is no rigidity, no rebound and no guarding.  Musculoskeletal: Normal range of motion.  Neurological: He is alert and oriented to person, place, and time. He has normal strength. No sensory deficit.  Skin: Skin is warm, dry and intact. Rash noted. Rash is urticarial.     Bee sting appearing as small ~17mm urticarial lesion to L neck along trapezius muscle, mild surrounding erythema, no surrounding swelling or induration to the area  Psychiatric: He has a normal mood and affect.  Nursing note and vitals reviewed.   ED Course  Procedures (including critical care time) Labs Review Labs Reviewed - No data to display  Imaging Review No results found. I have personally reviewed and evaluated these images and lab results as part of my medical decision-making.   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 male here with bee sting PTA. Remote hx of anaphylaxis with bee sting in  03/20/2010, became acutely SOB and with decreased LOC upon arrival to ER shortly after bee sting, was intubated, treated with decadron, benadryl, pepcid, and epinepherine and was extubated in the ER shortly after meds were given. Was observed overnight. Pt's epipen is out of date. He initially felt some tightness/SOB but took 2 benadryl en route and feels better. Sting mark appears to be a small urticarial lesion with mild erythema surrounding it. VSS. Will start IV and give IV decadron, benadryl, pepcid, but hold on epinepherine for now. Will update tetanus. Will monitor closely.   9:16 AM Pt feeling no SOB or chest tightness, feels well, 1hr since meds were given. Will observe for one more hour and if no  worsening or recurrence of symptoms, will send home with zantac, benadryl, prednisone, and epipen refills. Will reassess shortly.   10:18 AM No worsening, full resolution of symptoms. Airway intact. Will d/c with previously discussed plan. Will have him f/up with PCP and his allergist in 1wk. I explained the diagnosis and have given explicit precautions to return to the ER including for any other new or worsening symptoms. The patient understands and accepts the medical plan as it's been dictated and I have answered their questions. Discharge instructions concerning home care and prescriptions have been given. The patient is STABLE and is discharged to home in good condition.  BP 109/69 mmHg  Pulse 61  Temp(Src) 98.3 F (36.8 C) (Oral)  Resp 14  Ht  (1.727 m)  Wt 143 lb (64.864 kg)  BMI 21.75 kg/m2  SpO2 98%  Meds ordered this encounter  Medications  . dexamethasone (DECADRON) injection 10 mg    Sig:   . diphenhydrAMINE (BENADRYL) injection 25 mg    Sig:   . famotidine (PEPCID) IVPB 20 mg premix    Sig:   . Tdap (BOOSTRIX) injection 0.5 mL    Sig:   . EPINEPHrine 0.3 mg/0.3 mL IJ SOAJ injection    Sig: Inject 0.3 mLs (0.3 mg total) into the muscle as needed (anaphylaxis).    Dispense:  1 Device    Refill:  2    Order Specific Question:  Supervising Provider    Answer:  MILLER, BRIAN [[id number]]  . predniSONE (DELTASONE) 20 MG tablet    Sig: 3 tabs po daily x 3 days    Dispense:  9 tablet    Refill:  0    Order Specific Question:  Supervising Provider    Answer:  MILLER, BRIAN [[id number]]  . ranitidine (ZANTAC) 150 MG tablet    Sig: Take 1 tablet (150 mg total) by mouth 2 (two) times daily. X 3 days    Dispense:  6 tablet    Refill:  0    Order Specific Question:  Supervising Provider    Answer:  MILLER, BRIAN [[id number]]  . diphenhydrAMINE (BENADRYL) 25 MG tablet    Sig: Take 2 tablets (50 mg total) by mouth every 6 (six) hours as needed for itching or allergies.    Dispense:  20  tablet    Refill:  0    Order Specific Question:  Supervising Provider    Answer:  Eber Hong [[id number]]     Perkins Molina Camprubi-Soms, PA-C 05/15/15 1019  Jerelyn Scott, MD 05/15/15 1023

## 2015-05-15 NOTE — ED Notes (Signed)
Patient coming from home with c/o of bee sting to the left neck by a yellow jacket around 07:30 this am.  Patient is alert and oriented and speaking in full sentences.  Patient took 2 Benadryl this am prior to arrival.  Patients Epi Pen has expired.

## 2015-05-15 NOTE — Discharge Instructions (Signed)
Take Prednisone, Benadryl, and zantac as prescribed. Continue your usual home medications. Get plenty of rest and drink plenty of fluids. Avoid any known triggers. ALWAYS HAVE YOUR EPIPEN NEAR YOU! Please followup with your primary doctor listed on your medicaid card in 1 week for recheck. Follow up with your allergist in 1 week. Return to the ER for changes or worsening symptoms.   Anaphylactic Reaction An anaphylactic reaction is a sudden, severe allergic reaction. It affects the whole body. It can be life threatening. You may need to stay in the hospital.  [place] a medical bracelet or necklace that lists your allergy.  Carry your allergy kit or medicine shot to treat severe allergic reactions with you. These can save your life.  Do not drive until medicine from your shot has worn off, unless your doctor says it is okay.  If you have hives or a rash:  Take medicine as told by your doctor.  You may take over-the-counter antihistamine medicine.  Place cold cloths on your skin. Take baths in cool water. Avoid hot baths and hot showers. GET HELP RIGHT AWAY IF:   Your mouth is puffy (swollen), or you have trouble breathing.  You start making whistling sounds when you breathe (wheezing).  You have a tight feeling in your chest or throat.  You have a rash, hives, puffiness, or itching on your body.  You throw up (vomit) or have watery poop (diarrhea).  You feel dizzy or pass out (faint).  You think you are having an allergic reaction.  You have new symptoms. This is an emergency. Use your medicine shot or allergy kit as told. Call your local emergency services (911 in [place] Even if you feel better after the shot, you need to go to the hospital emergency department. MAKE SURE YOU:   Understand these instructions.  Will watch your condition.  Will get help right away if you are not doing well or get worse. Document Released: 02/29/2008 Document Revised: 03/13/2012  Document Reviewed: 12/14/2011 [facility] Patient Information 2015 [place], [place]. This information is not intended to replace advice given to you by your health care provider. Make sure you discuss any questions you have with your health care provider.  Bee, Wasp, or Hornet Sting Your caregiver has diagnosed you as having an insect sting. An insect sting appears as a red lump in the skin that sometimes has a tiny hole in the center, or it may have a stinger in the center of the wound. The most common stings are from wasps, hornets and bees. Individuals have different reactions to insect stings.  A normal reaction may cause pain, swelling, and redness around the sting site.  A localized allergic reaction may cause swelling and redness that extends beyond the sting site.  A large local reaction may continue to develop over the next 12 to 36 hours.  On occasion, the reactions can be severe (anaphylactic reaction). An anaphylactic reaction may cause wheezing; difficulty breathing; chest pain; fainting; raised, itchy, red patches on the skin; a sick feeling to your stomach (nausea); vomiting; cramping; or diarrhea. If you have had an anaphylactic reaction to an insect sting in the past, you are more likely to have one again. HOME CARE INSTRUCTIONS   With bee stings, a small sac of poison is left in the wound. Brushing across this with something such as a credit card, or anything similar, will help remove this and decrease the amount of the reaction. This same procedure will  not help a wasp sting as they do not leave behind a stinger and poison sac.  Apply a cold compress for 10 to 20 minutes every hour for 1 to 2 days, depending on severity, to reduce swelling and itching.  To lessen pain, a paste made of water and baking soda may be rubbed on the bite or sting and left on for 5 minutes.  To relieve itching and swelling, you may use take medication or apply medicated creams or lotions as  directed.  Only take over-the-counter or prescription medicines for pain, discomfort, or fever as directed by your caregiver.  Wash the sting site daily with soap and water. Apply antibiotic ointment on the sting site as directed.  If you suffered a severe reaction:  If you did not require hospitalization, an adult will need to stay with you for 24 hours in case the symptoms return.  You may need to wear a medical bracelet or necklace stating the allergy.  You and your family need to learn when and how to use an anaphylaxis kit or epinephrine injection.  If you have had a severe reaction before, always carry your anaphylaxis kit with you. SEEK MEDICAL CARE IF:   None of the above helps within 2 to 3 days.  The area becomes red, warm, tender, and swollen beyond the area of the bite or sting.  You have an oral temperature above 102 F (38.9 C). SEEK IMMEDIATE MEDICAL CARE IF:  You have symptoms of an allergic reaction which are:  Wheezing.  Difficulty breathing.  Chest pain.  Lightheadedness or fainting.  Itchy, raised, red patches on the skin.  Nausea, vomiting, cramping or diarrhea. ANY OF THESE SYMPTOMS MAY REPRESENT A SERIOUS PROBLEM THAT IS AN EMERGENCY. Do not wait to see if the symptoms will go away. Get medical help right away. Call your local emergency services (911 in [place] DO NOT drive yourself to the hospital. MAKE SURE YOU:   Understand these instructions.  Will watch your condition.  Will get help right away if you are not doing well or get worse. Document Released: 09/12/2005 Document Revised: 12/05/2011 Document Reviewed: 02/27/2010 [facility] Patient Information 2015 [place], [place]. This information is not intended to replace advice given to you by your health care provider. Make sure you discuss any questions you have with your health care provider.

## 2017-02-21 DIAGNOSIS — S39012A Strain of muscle, fascia and tendon of lower back, initial encounter: Secondary | ICD-10-CM | POA: Diagnosis not present

## 2017-03-28 DIAGNOSIS — H1032 Unspecified acute conjunctivitis, left eye: Secondary | ICD-10-CM | POA: Diagnosis not present

## 2017-03-28 DIAGNOSIS — Z6821 Body mass index (BMI) 21.0-21.9, adult: Secondary | ICD-10-CM | POA: Diagnosis not present

## 2017-06-27 DIAGNOSIS — K4090 Unilateral inguinal hernia, without obstruction or gangrene, not specified as recurrent: Secondary | ICD-10-CM | POA: Diagnosis not present

## 2017-07-28 DIAGNOSIS — K4090 Unilateral inguinal hernia, without obstruction or gangrene, not specified as recurrent: Secondary | ICD-10-CM | POA: Diagnosis not present

## 2017-08-07 DIAGNOSIS — J069 Acute upper respiratory infection, unspecified: Secondary | ICD-10-CM | POA: Diagnosis not present

## 2017-08-07 DIAGNOSIS — M546 Pain in thoracic spine: Secondary | ICD-10-CM | POA: Diagnosis not present

## 2017-08-07 DIAGNOSIS — Z6821 Body mass index (BMI) 21.0-21.9, adult: Secondary | ICD-10-CM | POA: Diagnosis not present

## 2017-09-17 ENCOUNTER — Emergency Department (HOSPITAL_COMMUNITY)
Admission: EM | Admit: 2017-09-17 | Discharge: 2017-09-17 | Disposition: A | Discharge status: Home / Self Care | Payer: BLUE CROSS/BLUE SHIELD | Attending: Emergency Medicine | Admitting: Emergency Medicine

## 2017-09-17 ENCOUNTER — Encounter (HOSPITAL_COMMUNITY): Payer: Self-pay

## 2017-09-17 DIAGNOSIS — J111 Influenza due to unidentified influenza virus with other respiratory manifestations: Secondary | ICD-10-CM | POA: Insufficient documentation

## 2017-09-17 DIAGNOSIS — M7918 Myalgia, other site: Secondary | ICD-10-CM | POA: Insufficient documentation

## 2017-09-17 DIAGNOSIS — F17210 Nicotine dependence, cigarettes, uncomplicated: Secondary | ICD-10-CM | POA: Insufficient documentation

## 2017-09-17 DIAGNOSIS — J029 Acute pharyngitis, unspecified: Secondary | ICD-10-CM | POA: Insufficient documentation

## 2017-09-17 DIAGNOSIS — B349 Viral infection, unspecified: Secondary | ICD-10-CM

## 2017-09-17 DIAGNOSIS — R509 Fever, unspecified: Secondary | ICD-10-CM | POA: Diagnosis not present

## 2017-09-17 DIAGNOSIS — R05 Cough: Secondary | ICD-10-CM | POA: Insufficient documentation

## 2017-09-17 MED ORDER — ACETAMINOPHEN ER 650 MG PO TBCR: 650.0000 mg | EXTENDED_RELEASE_TABLET | Freq: Three times a day (TID) | ORAL | 0 refills | Status: DC | PRN

## 2017-09-17 MED ORDER — NAPROXEN 500 MG PO TABS
500.0000 mg | ORAL_TABLET | Freq: Once | ORAL | Status: AC
  Administered 2017-09-17: 500 mg via ORAL
  Filled 2017-09-17: qty 1

## 2017-09-17 MED ORDER — NAPROXEN 500 MG PO TABS: 500.0000 mg | ORAL_TABLET | Freq: Two times a day (BID) | ORAL | 0 refills | Status: DC

## 2017-09-17 MED ORDER — ACETAMINOPHEN 325 MG PO TABS
650.0000 mg | ORAL_TABLET | Freq: Once | ORAL | Status: AC | PRN
  Administered 2017-09-17: 650 mg via ORAL
  Filled 2017-09-17: qty 2

## 2017-09-17 NOTE — ED Triage Notes (Signed)
Pt complains of flulike sx for three days Pt has body aches, shivering, diarrhea and no appettite

## 2017-09-17 NOTE — Discharge Instructions (Signed)
We saw you in the ER for what appears to be influenza. The treatment is symptomatic relief only, and your body will fight the infection off in a few days. We are prescribing you some meds for pain and fevers. Please hydrate well.  Please return to the ER if your symptoms worsen; you have chest pains, shortness of breath, inability to keep any medications down, confusion.

## 2017-09-17 NOTE — ED Provider Notes (Signed)
[facility]-EMERGENCY DEPT Provider Note   CSN: [id number] Arrival date & time: 09/17/17  0036     History   Chief Complaint Chief Complaint  Patient presents with  . flu like sx    HPI Jose Rangel is a 41 y.o. male.  HPI  SUBJECTIVE:  Jose Rangel is a 41 y.o. male who present complaining of flu-like symptoms: fevers, chills, myalgias, congestion, sore throat and cough for 3 days. Denies dyspnea, chest pain or wheezing.  OBJECTIVE: Appears moderately ill but not toxic; temperature as noted in vitals. Ears normal. Throat and pharynx normal.  Neck supple. No adenopathy in the neck. Sinuses non tender. The chest is clear.     Past Medical History:  Diagnosis Date  . Bee sting allergy     Patient Active Problem List   Diagnosis Date Noted  . 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 ANTERIOR CRUCIATE LIGAMENT REPAIR         Home Medications    Prior to Admission medications   Medication Sig Start Date End Date Taking? Authorizing Provider  acetaminophen (TYLENOL 8 HOUR) 650 MG CR tablet Take 1 tablet (650 mg total) by mouth every 8 (eight) hours as needed for pain. 09/17/17   Derwood KaplanNanavati, Zelene Barga, MD  diphenhydrAMINE (BENADRYL) 25 MG tablet Take 2 tablets (50 mg total) by mouth every 6 (six) hours as needed for itching or allergies. 05/15/15   Street, DentMercedes, PA-C  EPINEPHrine (EPIPEN JR) 0.15 MG/0.3ML injection Inject 0.15 mg into the muscle as needed.    [provider]  EPINEPHrine 0.3 mg/0.3 mL IJ SOAJ injection Inject 0.3 mLs (0.3 mg total) into the muscle as needed (anaphylaxis). 05/15/15   Street, Mercedes, PA-C  fluticasone (FLONASE) 50 MCG/ACT nasal spray Place 2 sprays into the nose daily. Patient not taking: Reported on 05/15/2015 07/05/13   Graylon GoodBaker, Zachary H, PA-C  methylPREDNISolone (MEDROL DOSEPAK) 4 MG tablet follow package directions Patient not taking: Reported on  05/15/2015 07/05/13   Graylon GoodBaker, Zachary H, PA-C  naproxen (NAPROSYN) 500 MG tablet Take 1 tablet (500 mg total) by mouth 2 (two) times daily with a meal. 09/17/17   Derwood KaplanNanavati, Dianne Whelchel, MD  predniSONE (DELTASONE) 20 MG tablet 3 tabs po daily x 3 days 05/15/15   Street, [place], PA-C  Pseudoephedrine-Ibuprofen (ADVIL COLD/SINUS) 30-200 MG TABS 2 tabs PO Q6 hrs PRN Patient not taking: Reported on 05/15/2015 11/27/13   Teressa LowerPickering, Vrinda, NP  ranitidine (ZANTAC) 150 MG tablet Take 1 tablet (150 mg total) by mouth 2 (two) times daily. X 3 days 05/15/15   Street, Al[place] SierraMercedes, [place]PA-C    Family History Family History  Problem Relation Age of Onset  . Hypertension Mother     Social History Social History   Tobacco Use  . Smoking status: Current Every Day Smoker    Types: Cigarettes  . Smokeless tobacco: Never Used  Substance Use Topics  . Alcohol use: Yes  . Drug use: Yes    Types: Marijuana     Allergies   Bee pollen   Review of Systems Review of Systems  Constitutional: Positive for activity change, chills and fever.  HENT: Positive for congestion.   Respiratory: Negative for shortness of breath.   Cardiovascular: Negative for chest pain.  Allergic/Immunologic: Negative for immunocompromised state.     Physical Exam Updated Vital Signs BP 136/78 (BP Location: Right Arm)   Pulse 82   Temp 99.4 F (37.4  C) (Oral)   Resp 17   SpO2 100%   Physical Exam  Constitutional: He is oriented to person, place, and time. He appears well-developed.  HENT:  Head: Atraumatic.  Neck: Neck supple.  Cardiovascular: Normal rate.  Pulmonary/Chest: Effort normal.  Lymphadenopathy:    He has cervical adenopathy.  Neurological: He is alert and oriented to person, place, and time.  Skin: Skin is warm.  Nursing note and vitals reviewed.    ED Treatments / Results  Labs (all labs ordered are listed, but only abnormal results are displayed) Labs Reviewed - No data to display  EKG  EKG  Interpretation None       Radiology No results found.  Procedures Procedures (including critical care time)  Medications Ordered in ED Medications  acetaminophen (TYLENOL) tablet 650 mg (650 mg Oral Given 09/17/17 0134)  naproxen (NAPROSYN) tablet 500 mg (500 mg Oral Given 09/17/17 0404)     Initial Impression / Assessment and Plan / ED Course  I have reviewed the triage vital signs and the nursing notes.  Pertinent labs & imaging results that were available during my care of the patient were reviewed by me and considered in my medical decision making (see chart for details).     ASSESSMENT: Influenza  PLAN: Symptomatic therapy suggested: increase fluids and call prn if symptoms persist or worsen. Call or return to clinic prn if these symptoms worsen or fail to improve as anticipated.  Final Clinical Impressions(s) / ED Diagnoses   Final diagnoses:  Influenza  Viral illness    ED Discharge Orders        Ordered    acetaminophen (TYLENOL 8 HOUR) 650 MG CR tablet  Every 8 hours PRN     09/17/17 0358    naproxen (NAPROSYN) 500 MG tablet  2 times daily with meals     09/17/17 0358       Derwood KaplanNanavati, Penni Penado, MD 09/17/17 [phone]

## 2017-09-20 ENCOUNTER — Encounter (HOSPITAL_COMMUNITY): Payer: Self-pay

## 2017-09-20 ENCOUNTER — Emergency Department (HOSPITAL_COMMUNITY): Payer: BLUE CROSS/BLUE SHIELD

## 2017-09-20 ENCOUNTER — Emergency Department (HOSPITAL_COMMUNITY)
Admission: EM | Admit: 2017-09-20 | Discharge: 2017-09-21 | Disposition: A | Discharge status: Home / Self Care | Payer: BLUE CROSS/BLUE SHIELD | Attending: Emergency Medicine | Admitting: Emergency Medicine

## 2017-09-20 DIAGNOSIS — M791 Myalgia, unspecified site: Secondary | ICD-10-CM | POA: Insufficient documentation

## 2017-09-20 DIAGNOSIS — R0981 Nasal congestion: Secondary | ICD-10-CM | POA: Diagnosis not present

## 2017-09-20 DIAGNOSIS — Z79899 Other long term (current) drug therapy: Secondary | ICD-10-CM | POA: Diagnosis not present

## 2017-09-20 DIAGNOSIS — R5383 Other fatigue: Secondary | ICD-10-CM | POA: Insufficient documentation

## 2017-09-20 DIAGNOSIS — R918 Other nonspecific abnormal finding of lung field: Secondary | ICD-10-CM | POA: Diagnosis not present

## 2017-09-20 DIAGNOSIS — R062 Wheezing: Secondary | ICD-10-CM | POA: Diagnosis not present

## 2017-09-20 DIAGNOSIS — R509 Fever, unspecified: Secondary | ICD-10-CM

## 2017-09-20 DIAGNOSIS — Z91030 Bee allergy status: Secondary | ICD-10-CM | POA: Insufficient documentation

## 2017-09-20 DIAGNOSIS — J3489 Other specified disorders of nose and nasal sinuses: Secondary | ICD-10-CM | POA: Diagnosis not present

## 2017-09-20 DIAGNOSIS — J189 Pneumonia, unspecified organism: Secondary | ICD-10-CM | POA: Diagnosis not present

## 2017-09-20 DIAGNOSIS — F17210 Nicotine dependence, cigarettes, uncomplicated: Secondary | ICD-10-CM | POA: Insufficient documentation

## 2017-09-20 DIAGNOSIS — R0982 Postnasal drip: Secondary | ICD-10-CM | POA: Insufficient documentation

## 2017-09-20 DIAGNOSIS — J181 Lobar pneumonia, unspecified organism: Secondary | ICD-10-CM | POA: Diagnosis not present

## 2017-09-20 DIAGNOSIS — R05 Cough: Secondary | ICD-10-CM | POA: Diagnosis not present

## 2017-09-20 DIAGNOSIS — R0602 Shortness of breath: Secondary | ICD-10-CM | POA: Insufficient documentation

## 2017-09-20 DIAGNOSIS — R042 Hemoptysis: Secondary | ICD-10-CM | POA: Diagnosis not present

## 2017-09-20 LAB — CBC WITH DIFFERENTIAL/PLATELET
Basophils Absolute: 0 10*3/uL (ref 0.0–0.1)
Basophils Relative: 0 %
EOS ABS: 0 10*3/uL (ref 0.0–0.7)
Eosinophils Relative: 0 %
HCT: 41.2 % (ref 39.0–52.0)
HEMOGLOBIN: 13.7 g/dL (ref 13.0–17.0)
LYMPHS ABS: 1.9 10*3/uL (ref 0.7–4.0)
Lymphocytes Relative: 29 %
MCH: 27.9 pg (ref 26.0–34.0)
MCHC: 33.3 g/dL (ref 30.0–36.0)
MCV: 83.9 fL (ref 78.0–100.0)
MONOS PCT: 14 %
Monocytes Absolute: 0.9 10*3/uL (ref 0.1–1.0)
NEUTROS PCT: 57 %
Neutro Abs: 3.8 10*3/uL (ref 1.7–7.7)
Platelets: 180 10*3/uL (ref 150–400)
RBC: 4.91 MIL/uL (ref 4.22–5.81)
RDW: 13.9 % (ref 11.5–15.5)
WBC: 6.7 10*3/uL (ref 4.0–10.5)

## 2017-09-20 LAB — COMPREHENSIVE METABOLIC PANEL
ALT: 22 U/L (ref 17–63)
AST: 30 U/L (ref 15–41)
Albumin: 3.8 g/dL (ref 3.5–5.0)
Alkaline Phosphatase: 54 U/L (ref 38–126)
Anion gap: 9 (ref 5–15)
BUN: 15 mg/dL (ref 6–20)
CHLORIDE: 100 mmol/L — AB (ref 101–111)
CO2: 26 mmol/L (ref 22–32)
CREATININE: 1.16 mg/dL (ref 0.61–1.24)
Calcium: 8.6 mg/dL — ABNORMAL LOW (ref 8.9–10.3)
GFR calc non Af Amer: >60 mL/min (ref >60)
Glucose, Bld: 104 mg/dL — ABNORMAL HIGH (ref 65–99)
Potassium: 3.7 mmol/L (ref 3.5–5.1)
SODIUM: 135 mmol/L (ref 135–145)
Total Bilirubin: 0.6 mg/dL (ref 0.3–1.2)
Total Protein: 7.4 g/dL (ref 6.5–8.1)

## 2017-09-20 LAB — URINALYSIS, ROUTINE W REFLEX MICROSCOPIC
Bilirubin Urine: NEGATIVE
GLUCOSE, UA: NEGATIVE mg/dL
Hgb urine dipstick: NEGATIVE
Ketones, ur: 5 mg/dL — AB
Nitrite: NEGATIVE
PROTEIN: 100 mg/dL — AB
Specific Gravity, Urine: 1.031 — ABNORMAL HIGH (ref 1.005–1.030)
pH: 6 (ref 5.0–8.0)

## 2017-09-20 LAB — I-STAT CG4 LACTIC ACID, ED: LACTIC ACID, VENOUS: 0.8 mmol/L (ref 0.5–1.9)

## 2017-09-20 MED ORDER — ACETAMINOPHEN 325 MG PO TABS
650.0000 mg | ORAL_TABLET | Freq: Once | ORAL | Status: AC
  Administered 2017-09-20: 650 mg via ORAL
  Filled 2017-09-20: qty 2

## 2017-09-20 NOTE — ED Triage Notes (Signed)
Pt was seen last week for the same and is getting worse, pt has a fever and a productive cough

## 2017-09-21 ENCOUNTER — Emergency Department (HOSPITAL_COMMUNITY): Payer: BLUE CROSS/BLUE SHIELD

## 2017-09-21 ENCOUNTER — Encounter (HOSPITAL_COMMUNITY): Payer: Self-pay

## 2017-09-21 DIAGNOSIS — R918 Other nonspecific abnormal finding of lung field: Secondary | ICD-10-CM | POA: Diagnosis not present

## 2017-09-21 LAB — I-STAT CG4 LACTIC ACID, ED: LACTIC ACID, VENOUS: 0.68 mmol/L (ref 0.5–1.9)

## 2017-09-21 MED ORDER — IOPAMIDOL (ISOVUE-300) INJECTION 61%
INTRAVENOUS | Status: DC
Start: 2017-09-21 — End: 2017-09-21
  Filled 2017-09-21: qty 75

## 2017-09-21 MED ORDER — AEROCHAMBER PLUS FLO-VU MEDIUM MISC
1.0000 | Freq: Once | Status: AC
  Administered 2017-09-21: 1
  Filled 2017-09-21: qty 1

## 2017-09-21 MED ORDER — ALBUTEROL SULFATE (2.5 MG/3ML) 0.083% IN NEBU
5.0000 mg | INHALATION_SOLUTION | Freq: Once | RESPIRATORY_TRACT | Status: AC
  Administered 2017-09-21: 5 mg via RESPIRATORY_TRACT
  Filled 2017-09-21: qty 6

## 2017-09-21 MED ORDER — ALBUTEROL SULFATE HFA 108 (90 BASE) MCG/ACT IN AERS
2.0000 | INHALATION_SPRAY | RESPIRATORY_TRACT | Status: DC | PRN
  Administered 2017-09-21: 2 via RESPIRATORY_TRACT
  Filled 2017-09-21: qty 6.7

## 2017-09-21 MED ORDER — BENZONATATE 100 MG PO CAPS: 100.0000 mg | ORAL_CAPSULE | Freq: Three times a day (TID) | ORAL | 0 refills | Status: DC | PRN

## 2017-09-21 MED ORDER — FLUTICASONE PROPIONATE 50 MCG/ACT NA SUSP: 2.0000 | Freq: Every day | NASAL | 2 refills | Status: AC

## 2017-09-21 MED ORDER — AZITHROMYCIN 250 MG PO TABS: 250.0000 mg | ORAL_TABLET | Freq: Every day | ORAL | 0 refills | Status: DC

## 2017-09-21 MED ORDER — AZITHROMYCIN 250 MG PO TABS
500.0000 mg | ORAL_TABLET | Freq: Once | ORAL | Status: AC
  Administered 2017-09-21: 500 mg via ORAL
  Filled 2017-09-21: qty 2

## 2017-09-21 MED ORDER — SODIUM CHLORIDE 0.9 % IV BOLUS (SEPSIS)
1000.0000 mL | Freq: Once | INTRAVENOUS | Status: AC
  Administered 2017-09-21: 1000 mL via INTRAVENOUS

## 2017-09-21 MED ORDER — KETOROLAC TROMETHAMINE 30 MG/ML IJ SOLN
30.0000 mg | Freq: Once | INTRAMUSCULAR | Status: AC
  Administered 2017-09-21: 30 mg via INTRAVENOUS
  Filled 2017-09-21: qty 1

## 2017-09-21 MED ORDER — IOPAMIDOL (ISOVUE-300) INJECTION 61%
75.0000 mL | Freq: Once | INTRAVENOUS | Status: AC | PRN
  Administered 2017-09-21: 75 mL via INTRAVENOUS

## 2017-09-21 NOTE — Discharge Instructions (Signed)
1. Medications: flonase, OTC mucinex, tessalon, Azithromycin, usual home medications 2. Treatment: rest, drink plenty of fluids, take tylenol or ibuprofen for fever control 3. Follow Up: Please followup with your primary doctor in 3 days for discussion of your diagnoses and further evaluation after today's visit; if you do not have a primary care doctor use the resource guide provided to find one; Return to the ER for high fevers, difficulty breathing or other concerning symptoms

## 2017-09-21 NOTE — ED Provider Notes (Signed)
[facility]-EMERGENCY DEPT Provider Note   CSN: [id number] Arrival date & time: 09/20/17  2144     History   Chief Complaint Chief Complaint  Patient presents with  . flu like sx    HPI Jose Rangel is a 41 y.o. male with no major medical hx presents to the Emergency Department complaining of gradual, persistent, progressively worsening URI symptoms onset 5 days ago. Associated symptoms include chills, fatigue, loss of appetite, nasal congestion, cough.  Pt reports tonight he coughed up blood.  Pt reports he was evaluated on 09/16/17 for similar symptoms and given tylenol and naprosyn for fever control.  He reports taking those, but has not measured his fever since discharge. Pt reports myalgias, but no focal pain.  Ibuprofen has made his symptoms better.  Pt denies neck pain, neck stiffness, chest pain, abd pain, N/V/D, weakness, dizziness, syncope, dysuria.     The history is provided by the patient and medical records. No language interpreter was used.    Past Medical History:  Diagnosis Date  . Bee sting allergy     Patient Active Problem List   Diagnosis Date Noted  . 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 ANTERIOR CRUCIATE LIGAMENT REPAIR         Home Medications    Prior to Admission medications   Medication Sig Start Date End Date Taking? Authorizing Provider  EPINEPHrine 0.3 mg/0.3 mL IJ SOAJ injection Inject 0.3 mLs (0.3 mg total) into the muscle as needed (anaphylaxis). 05/15/15  Yes Street, [place], PA-C  naproxen sodium (ALEVE) 220 MG tablet Take 220 mg by mouth daily as needed (pain).   Yes [provider]  azithromycin (ZITHROMAX) 250 MG tablet Take 1 tablet (250 mg total) by mouth daily. Take first 2 tablets together, then 1 every day until finished. 09/21/17   Judy Goodenow, Dahlia Client, PA-C  benzonatate (TESSALON PERLES) 100 MG capsule Take 1 capsule (100 mg total) by mouth  3 (three) times daily as needed for cough (cough). 09/21/17   Darion Juhasz, Dahlia Client, PA-C  fluticasone (FLONASE) 50 MCG/ACT nasal spray Place 2 sprays into both nostrils daily. 09/21/17   Wyatt Thorstenson, Dahlia Client, PA-C    Family History Family History  Problem Relation Age of Onset  . Hypertension Mother     Social History Social History   Tobacco Use  . Smoking status: Current Every Day Smoker    Types: Cigarettes  . Smokeless tobacco: Never Used  Substance Use Topics  . Alcohol use: Yes  . Drug use: Yes    Types: Marijuana     Allergies   Bee pollen   Review of Systems Review of Systems  Constitutional: Positive for fatigue. Negative for appetite change, chills, diaphoresis, fever and unexpected weight change.  HENT: Positive for congestion, postnasal drip, rhinorrhea, sinus pressure and sore throat. Negative for ear discharge, ear pain and mouth sores.   Eyes: Negative for visual disturbance.  Respiratory: Positive for cough, chest tightness, shortness of breath and wheezing. Negative for stridor.   Cardiovascular: Negative for chest pain, palpitations and leg swelling.  Gastrointestinal: Negative for abdominal pain, constipation, diarrhea, nausea and vomiting.  Endocrine: Negative for polydipsia, polyphagia and polyuria.  Genitourinary: Negative for dysuria, frequency, hematuria and urgency.  Musculoskeletal: Positive for myalgias. Negative for arthralgias, back pain and neck stiffness.  Skin: Negative for rash.  Allergic/Immunologic: Negative for immunocompromised state.  Neurological: Negative for syncope, light-headedness, numbness and headaches.  Hematological: Negative  for adenopathy. Does not bruise/bleed easily.  Psychiatric/Behavioral: Negative for sleep disturbance. The patient is not nervous/anxious.   All other systems reviewed and are negative.    Physical Exam Updated Vital Signs BP 119/79 (BP Location: Right Arm)   Pulse 83   Temp (!) 102.3 F (39.1  C) (Oral)   Resp 20   SpO2 95%   Physical Exam  Constitutional: He appears well-developed and well-nourished. No distress.  Awake, alert, nontoxic appearance  HENT:  Head: Normocephalic and atraumatic.  Right Ear: Tympanic membrane, external ear and ear canal normal.  Left Ear: Tympanic membrane, external ear and ear canal normal.  Nose: Mucosal edema and rhinorrhea present. No epistaxis. Right sinus exhibits no maxillary sinus tenderness and no frontal sinus tenderness. Left sinus exhibits no maxillary sinus tenderness and no frontal sinus tenderness.  Mouth/Throat: Uvula is midline and mucous membranes are normal. Mucous membranes are not pale and not cyanotic. Posterior oropharyngeal erythema present. No oropharyngeal exudate, posterior oropharyngeal edema or tonsillar abscesses.  Eyes: Conjunctivae are normal. Pupils are equal, round, and reactive to light. No scleral icterus.  Neck: Normal range of motion and full passive range of motion without pain. Neck supple.  Cardiovascular: Normal rate, regular rhythm and intact distal pulses.  Pulmonary/Chest: Effort normal. No stridor. No respiratory distress. He has wheezes (fine, expiratory).  Clear and equal breath sounds without focal wheezes, rhonchi, rales  Abdominal: Soft. Bowel sounds are normal. He exhibits no mass. There is no tenderness. There is no rebound and no guarding.  Musculoskeletal: Normal range of motion. He exhibits no edema.  Lymphadenopathy:    He has no cervical adenopathy.  Neurological: He is alert.  Speech is clear and goal oriented Moves extremities without ataxia  Skin: Skin is warm and dry. No rash noted. He is not diaphoretic.  Psychiatric: He has a normal mood and affect.  Nursing note and vitals reviewed.    ED Treatments / Results  Labs (all labs ordered are listed, but only abnormal results are displayed) Labs Reviewed  COMPREHENSIVE METABOLIC PANEL - Abnormal; Notable for the following components:       Result Value   Chloride 100 (*)    Glucose, Bld 104 (*)    Calcium 8.6 (*)    All other components within normal limits  URINALYSIS, ROUTINE W REFLEX MICROSCOPIC - Abnormal; Notable for the following components:   Specific Gravity, Urine 1.031 (*)    Ketones, ur 5 (*)    Protein, ur 100 (*)    Leukocytes, UA SMALL (*)    Bacteria, UA RARE (*)    Squamous Epithelial / LPF 0-5 (*)    All other components within normal limits  CBC WITH DIFFERENTIAL/PLATELET  I-STAT CG4 LACTIC ACID, ED  I-STAT CG4 LACTIC ACID, ED     Radiology Dg Chest 2 View  Result Date: 09/20/2017 CLINICAL DATA:  Acute onset of fever and productive cough. EXAM: CHEST  2 VIEW COMPARISON:  Chest radiograph performed 03/20/2010 FINDINGS: A vague 2.0 cm nodule is noted near the left lung apex. No definite focal airspace consolidation, pleural effusion or pneumothorax is seen. The heart is normal in size. No acute osseous abnormalities are identified. IMPRESSION: Vague 2.0 cm nodule near the left lung apex. CT of the chest is recommended for further evaluation, when and as deemed clinically appropriate. Electronically Signed   By: Roanna RaiderJeffery  Chang M.D.   On: 09/20/2017 22:30   Ct Chest W Contrast  Result Date: 09/21/2017 CLINICAL DATA:  41  y/o M; fever, productive cough, hemoptysis, wheezing, and lung nodule on chest radiograph. EXAM: CT CHEST WITH CONTRAST TECHNIQUE: Multidetector CT imaging of the chest was performed during intravenous contrast administration. CONTRAST:  75mL ISOVUE-300 IOPAMIDOL (ISOVUE-300) INJECTION 61% COMPARISON:  09/20/2017 chest radiograph FINDINGS: Cardiovascular: No significant vascular findings. Normal heart size. No pericardial effusion. Mediastinum/Nodes: No enlarged mediastinal, hilar, or axillary lymph nodes. Thyroid gland, trachea, and esophagus demonstrate no significant findings. Lungs/Pleura: Nodular opacity on chest radiograph corresponds to a small focus consolidation on CT there  several additional foci of consolidation at the periphery of the lungs in the bilateral upper lobes and right lung base. Trace left pleural effusion. No pneumothorax. Upper Abdomen: Liver segment 8 indeterminate 20 mm hypoattenuating focus. Musculoskeletal: No chest wall abnormality. No acute or significant osseous findings. IMPRESSION: 1. Nodular opacity on chest radiograph corresponds to a small focus of consolidation in left upper lobe. There are additional small foci of consolidation at the periphery of the lungs bilaterally. Findings probably represent acute pneumonitis. Trace left pleural effusion. Followup PA and lateral chest X-ray is recommended in 3-4 weeks following trial of antibiotic therapy to ensure resolution and exclude underlying malignancy. 2. Indeterminate 20 mm lesion in liver segment 8. This can be further characterized with liver protocol MRI on a nonemergent basis. Electronically Signed   By: Mitzi HansenLance  Furusawa-Stratton M.D.   On: 09/21/2017 03:36    Procedures Procedures (including critical care time)  Medications Ordered in ED Medications  iopamidol (ISOVUE-300) 61 % injection (not administered)  albuterol (PROVENTIL HFA;VENTOLIN HFA) 108 (90 Base) MCG/ACT inhaler 2 puff (not administered)  AEROCHAMBER PLUS FLO-VU MEDIUM MISC 1 each (not administered)  acetaminophen (TYLENOL) tablet 650 mg (650 mg Oral Given 09/20/17 2220)  sodium chloride 0.9 % bolus 1,000 mL (1,000 mLs Intravenous New Bag/Given 09/21/17 0249)  albuterol (PROVENTIL) (2.5 MG/3ML) 0.083% nebulizer solution 5 mg (5 mg Nebulization Given 09/21/17 0146)  iopamidol (ISOVUE-300) 61 % injection 75 mL (75 mLs Intravenous Contrast Given 09/21/17 0302)  ketorolac (TORADOL) 30 MG/ML injection 30 mg (30 mg Intravenous Given 09/21/17 0411)  azithromycin (ZITHROMAX) tablet 500 mg (500 mg Oral Given 09/21/17 0411)     Initial Impression / Assessment and Plan / ED Course  I have reviewed the triage vital signs and the  nursing notes.  Pertinent labs & imaging results that were available during my care of the patient were reviewed by me and considered in my medical decision making (see chart for details).     Patient has been diagnosed with CAP via chest xray. Pt is not ill appearing, immunocompromised, and does not have multiple co morbidities, therefore I feel like the they can be treated as an OP with abx therapy.  Patient initially febrile on arrival but this has resolved.  No tachycardia.  Patient given fluids and albuterol treatment.  Breath sounds improved.  Pt has been advised to return to the ED if symptoms worsen or they do not improve. Pt verbalizes understanding and is agreeable with plan.    Final Clinical Impressions(s) / ED Diagnoses   Final diagnoses:  Community acquired pneumonia of left upper lobe of lung (HCC)  Pneumonitis  Fever, unspecified fever cause    ED Discharge Orders        Ordered    azithromycin (ZITHROMAX) 250 MG tablet  Daily     09/21/17 0408    fluticasone (FLONASE) 50 MCG/ACT nasal spray  Daily     09/21/17 0408    benzonatate (TESSALON PERLES) 100  MG capsule  3 times daily PRN     09/21/17 0408       Zaid Tomes, Boyd Kerbs 09/21/17 0415    Geoffery Lyons, MD 09/21/17 [phone]

## 2017-09-24 DIAGNOSIS — J189 Pneumonia, unspecified organism: Secondary | ICD-10-CM | POA: Diagnosis not present

## 2018-04-09 ENCOUNTER — Other Ambulatory Visit: Payer: Self-pay | Admitting: Family Medicine

## 2018-04-09 DIAGNOSIS — K4090 Unilateral inguinal hernia, without obstruction or gangrene, not specified as recurrent: Secondary | ICD-10-CM | POA: Diagnosis not present

## 2018-04-09 DIAGNOSIS — Z91030 Bee allergy status: Secondary | ICD-10-CM | POA: Diagnosis not present

## 2018-04-09 DIAGNOSIS — Z118 Encounter for screening for other infectious and parasitic diseases: Secondary | ICD-10-CM | POA: Diagnosis not present

## 2018-04-09 DIAGNOSIS — D179 Benign lipomatous neoplasm, unspecified: Secondary | ICD-10-CM | POA: Diagnosis not present

## 2018-04-09 DIAGNOSIS — R2232 Localized swelling, mass and lump, left upper limb: Secondary | ICD-10-CM

## 2018-04-09 DIAGNOSIS — L739 Follicular disorder, unspecified: Secondary | ICD-10-CM | POA: Diagnosis not present

## 2018-04-09 DIAGNOSIS — M7989 Other specified soft tissue disorders: Secondary | ICD-10-CM

## 2018-04-11 ENCOUNTER — Ambulatory Visit
Admission: RE | Admit: 2018-04-11 | Discharge: 2018-04-11 | Disposition: A | Discharge status: Home / Self Care | Payer: BLUE CROSS/BLUE SHIELD | Source: Ambulatory Visit | Attending: Family Medicine | Admitting: Family Medicine

## 2018-04-11 DIAGNOSIS — R2232 Localized swelling, mass and lump, left upper limb: Secondary | ICD-10-CM

## 2018-04-11 DIAGNOSIS — Z803 Family history of malignant neoplasm of breast: Secondary | ICD-10-CM | POA: Diagnosis not present

## 2018-04-11 DIAGNOSIS — N6489 Other specified disorders of breast: Secondary | ICD-10-CM | POA: Diagnosis not present

## 2018-04-13 ENCOUNTER — Ambulatory Visit
Admission: RE | Admit: 2018-04-13 | Discharge: 2018-04-13 | Disposition: A | Discharge status: Home / Self Care | Payer: BLUE CROSS/BLUE SHIELD | Source: Ambulatory Visit | Attending: Family Medicine | Admitting: Family Medicine

## 2018-04-13 DIAGNOSIS — R2231 Localized swelling, mass and lump, right upper limb: Secondary | ICD-10-CM | POA: Diagnosis not present

## 2018-04-13 DIAGNOSIS — M7989 Other specified soft tissue disorders: Secondary | ICD-10-CM

## 2018-04-30 DIAGNOSIS — M6289 Other specified disorders of muscle: Secondary | ICD-10-CM | POA: Diagnosis not present

## 2018-04-30 DIAGNOSIS — L723 Sebaceous cyst: Secondary | ICD-10-CM | POA: Diagnosis not present

## 2018-04-30 DIAGNOSIS — M545 Low back pain: Secondary | ICD-10-CM | POA: Diagnosis not present

## 2018-04-30 DIAGNOSIS — Z6822 Body mass index (BMI) 22.0-22.9, adult: Secondary | ICD-10-CM | POA: Diagnosis not present

## 2018-05-10 DIAGNOSIS — K4090 Unilateral inguinal hernia, without obstruction or gangrene, not specified as recurrent: Secondary | ICD-10-CM | POA: Diagnosis not present

## 2018-07-06 DIAGNOSIS — L723 Sebaceous cyst: Secondary | ICD-10-CM | POA: Diagnosis not present

## 2018-07-06 DIAGNOSIS — K4090 Unilateral inguinal hernia, without obstruction or gangrene, not specified as recurrent: Secondary | ICD-10-CM | POA: Diagnosis not present

## 2018-07-06 DIAGNOSIS — D176 Benign lipomatous neoplasm of spermatic cord: Secondary | ICD-10-CM | POA: Diagnosis not present

## 2018-07-06 DIAGNOSIS — L720 Epidermal cyst: Secondary | ICD-10-CM | POA: Diagnosis not present

## 2018-07-06 DIAGNOSIS — D1721 Benign lipomatous neoplasm of skin and subcutaneous tissue of right arm: Secondary | ICD-10-CM | POA: Diagnosis not present

## 2018-07-24 DIAGNOSIS — Z0000 Encounter for general adult medical examination without abnormal findings: Secondary | ICD-10-CM | POA: Diagnosis not present

## 2018-07-24 DIAGNOSIS — Z13220 Encounter for screening for lipoid disorders: Secondary | ICD-10-CM | POA: Diagnosis not present

## 2018-07-24 DIAGNOSIS — Z125 Encounter for screening for malignant neoplasm of prostate: Secondary | ICD-10-CM | POA: Diagnosis not present

## 2018-07-24 DIAGNOSIS — Z1329 Encounter for screening for other suspected endocrine disorder: Secondary | ICD-10-CM | POA: Diagnosis not present

## 2018-07-24 DIAGNOSIS — Z114 Encounter for screening for human immunodeficiency virus [HIV]: Secondary | ICD-10-CM | POA: Diagnosis not present

## 2018-07-27 DIAGNOSIS — Z6822 Body mass index (BMI) 22.0-22.9, adult: Secondary | ICD-10-CM | POA: Diagnosis not present

## 2018-07-27 DIAGNOSIS — Z0000 Encounter for general adult medical examination without abnormal findings: Secondary | ICD-10-CM | POA: Diagnosis not present

## 2018-07-27 DIAGNOSIS — Z23 Encounter for immunization: Secondary | ICD-10-CM | POA: Diagnosis not present

## 2018-08-31 DIAGNOSIS — Z23 Encounter for immunization: Secondary | ICD-10-CM | POA: Diagnosis not present

## 2019-02-06 DIAGNOSIS — Z23 Encounter for immunization: Secondary | ICD-10-CM | POA: Diagnosis not present

## 2019-03-16 DIAGNOSIS — R8289 Other abnormal findings on cytological and histological examination of urine: Secondary | ICD-10-CM | POA: Diagnosis not present

## 2019-07-04 DIAGNOSIS — J039 Acute tonsillitis, unspecified: Secondary | ICD-10-CM | POA: Diagnosis not present

## 2019-07-12 DIAGNOSIS — H109 Unspecified conjunctivitis: Secondary | ICD-10-CM | POA: Diagnosis not present

## 2020-02-25 ENCOUNTER — Other Ambulatory Visit: Payer: Self-pay

## 2020-02-25 ENCOUNTER — Emergency Department (HOSPITAL_COMMUNITY)
Admission: EM | Admit: 2020-02-25 | Discharge: 2020-02-25 | Disposition: A | Discharge status: Home / Self Care | Payer: BC Managed Care – PPO | Attending: Emergency Medicine | Admitting: Emergency Medicine

## 2020-02-25 ENCOUNTER — Emergency Department (HOSPITAL_COMMUNITY): Payer: BC Managed Care – PPO

## 2020-02-25 ENCOUNTER — Encounter (HOSPITAL_COMMUNITY): Payer: Self-pay

## 2020-02-25 DIAGNOSIS — Y93I9 Activity, other involving external motion: Secondary | ICD-10-CM | POA: Diagnosis not present

## 2020-02-25 DIAGNOSIS — M25552 Pain in left hip: Secondary | ICD-10-CM | POA: Diagnosis not present

## 2020-02-25 DIAGNOSIS — R1084 Generalized abdominal pain: Secondary | ICD-10-CM | POA: Diagnosis not present

## 2020-02-25 DIAGNOSIS — Z87891 Personal history of nicotine dependence: Secondary | ICD-10-CM | POA: Diagnosis not present

## 2020-02-25 DIAGNOSIS — Z79899 Other long term (current) drug therapy: Secondary | ICD-10-CM | POA: Insufficient documentation

## 2020-02-25 DIAGNOSIS — Y92410 Unspecified street and highway as the place of occurrence of the external cause: Secondary | ICD-10-CM | POA: Diagnosis not present

## 2020-02-25 DIAGNOSIS — Y999 Unspecified external cause status: Secondary | ICD-10-CM | POA: Diagnosis not present

## 2020-02-25 LAB — URINALYSIS, ROUTINE W REFLEX MICROSCOPIC
Bilirubin Urine: NEGATIVE
Glucose, UA: NEGATIVE mg/dL
Ketones, ur: NEGATIVE mg/dL
Leukocytes,Ua: NEGATIVE
Nitrite: NEGATIVE
Protein, ur: NEGATIVE mg/dL
RBC / HPF: >50 RBC/hpf — ABNORMAL HIGH (ref 0–5)
Specific Gravity, Urine: 1.006 (ref 1.005–1.030)
pH: 9 — ABNORMAL HIGH (ref 5.0–8.0)

## 2020-02-25 LAB — COMPREHENSIVE METABOLIC PANEL
ALT: 16 U/L (ref 0–44)
AST: 24 U/L (ref 15–41)
Albumin: 4.1 g/dL (ref 3.5–5.0)
Alkaline Phosphatase: 46 U/L (ref 38–126)
Anion gap: 10 (ref 5–15)
BUN: 15 mg/dL (ref 6–20)
CO2: 25 mmol/L (ref 22–32)
Calcium: 9.4 mg/dL (ref 8.9–10.3)
Chloride: 103 mmol/L (ref 98–111)
Creatinine, Ser: 1.24 mg/dL (ref 0.61–1.24)
GFR calc Af Amer: >60 mL/min (ref >60)
GFR calc non Af Amer: >60 mL/min (ref >60)
Glucose, Bld: 92 mg/dL (ref 70–99)
Potassium: 4.4 mmol/L (ref 3.5–5.1)
Sodium: 138 mmol/L (ref 135–145)
Total Bilirubin: 0.7 mg/dL (ref 0.3–1.2)
Total Protein: 7 g/dL (ref 6.5–8.1)

## 2020-02-25 LAB — PROTIME-INR
INR: 1.1 (ref 0.8–1.2)
Prothrombin Time: 13.7 seconds (ref 11.4–15.2)

## 2020-02-25 LAB — CBC
HCT: 40.8 % (ref 39.0–52.0)
Hemoglobin: 13 g/dL (ref 13.0–17.0)
MCH: 27.5 pg (ref 26.0–34.0)
MCHC: 31.9 g/dL (ref 30.0–36.0)
MCV: 86.3 fL (ref 80.0–100.0)
Platelets: 238 10*3/uL (ref 150–400)
RBC: 4.73 MIL/uL (ref 4.22–5.81)
RDW: 13.6 % (ref 11.5–15.5)
WBC: 4.8 10*3/uL (ref 4.0–10.5)
nRBC: 0 % (ref 0.0–0.2)

## 2020-02-25 MED ORDER — HYDROCODONE-ACETAMINOPHEN 5-325 MG PO TABS
1.0000 | ORAL_TABLET | Freq: Once | ORAL | Status: AC
  Administered 2020-02-25: 1 via ORAL
  Filled 2020-02-25: qty 1

## 2020-02-25 MED ORDER — METHOCARBAMOL 500 MG PO TABS: 500.0000 mg | ORAL_TABLET | Freq: Two times a day (BID) | ORAL | 0 refills | Status: AC

## 2020-02-25 MED ORDER — IOHEXOL 300 MG/ML  SOLN
100.0000 mL | Freq: Once | INTRAMUSCULAR | Status: AC | PRN
  Administered 2020-02-25: 100 mL via INTRAVENOUS

## 2020-02-25 MED ORDER — NAPROXEN 375 MG PO TABS: 375.0000 mg | ORAL_TABLET | Freq: Two times a day (BID) | ORAL | 0 refills | Status: AC
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
## 2020-02-25 NOTE — ED Notes (Signed)
Pt ambulated ok but stated he would feel more comfortable with an assistive device. Very wary to put weight on left hip.

## 2020-02-25 NOTE — Discharge Instructions (Signed)
I have provided a copy of your CT results.  You will need to follow-up with neurosurgery for your likely compression fracture on your lumbar spine.  I have also prescribed a short course of anti-inflammatories along with muscle relaxers to help with your symptoms.  Please take this as prescribed.  You may also apply ice or heat to your back to help with symptomatic control.

## 2020-05-24 ENCOUNTER — Encounter (HOSPITAL_COMMUNITY): Payer: Self-pay

## 2020-05-24 ENCOUNTER — Ambulatory Visit (HOSPITAL_COMMUNITY)
Admission: EM | Admit: 2020-05-24 | Discharge: 2020-05-24 | Disposition: A | Discharge status: Home / Self Care | Payer: BC Managed Care – PPO | Attending: Physician Assistant | Admitting: Physician Assistant

## 2020-05-24 DIAGNOSIS — T63441A Toxic effect of venom of bees, accidental (unintentional), initial encounter: Secondary | ICD-10-CM | POA: Diagnosis not present

## 2020-05-24 DIAGNOSIS — Z889 Allergy status to unspecified drugs, medicaments and biological substances status: Secondary | ICD-10-CM | POA: Diagnosis not present

## 2020-05-24 MED ORDER — EPINEPHRINE 0.3 MG/0.3ML IJ SOAJ: 0.3000 mg | INTRAMUSCULAR | 1 refills | Status: AC | PRN

## 2020-05-24 MED ORDER — PREDNISONE 10 MG PO TABS
ORAL_TABLET | ORAL | 0 refills | Status: AC
Start: 2020-05-24 — End: 2020-05-28

## 2020-05-24 NOTE — ED Triage Notes (Addendum)
Pt presents with complaints of bee sting today. States that he usually has to take an epi pen for this but he is out of his epi pen. Patient denies any allergic reaction symptoms at this time. States he took 50mg  of benadryl prior to coming in.   Pt educated to alert staff if he begins developing any allergic reaction symptoms such as swelling, itching, shortness of breath or chest tightness. Pt verbalized understanding.

## 2020-05-24 NOTE — Discharge Instructions (Signed)
Take the prednisone  Take an additional dose of benadryl in about 2-3 hours  I have refilled the epipen, if you have shortness of breath, throat closing sensation , use Epipen and Call 911

## 2020-05-24 NOTE — ED Provider Notes (Signed)
[facility]    CSN: [id number] Arrival date & time: 05/24/20  1302      History   Chief Complaint Chief Complaint  Patient presents with  . Allergic Reaction    HPI Jose Rangel is a 44 y.o. male.   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  He reports he was stung by some sort of bee 2 hours prior to arrival.  He took Benadryl 50 mg immediately.  He reports he has not developed a rash, has no difficulty breathing or tongue or throat swelling.  Does endorse a little tired from taking the Benadryl.  He reports several years ago he was stung by several bees requiring intubation due to anaphylaxis.  He reports he does not have an EpiPen now.  Reports he was only stung by one bee today unsure of the type.     Past Medical History:  Diagnosis Date  . Bee sting allergy     Patient Active Problem List   Diagnosis Date Noted  . 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 ANTERIOR CRUCIATE LIGAMENT REPAIR    . HERNIA REPAIR         Home Medications    Prior to Admission medications   Medication Sig Start Date End Date Taking? Authorizing Provider  azithromycin (ZITHROMAX) 250 MG tablet Take 1 tablet (250 mg total) by mouth daily. Take first 2 tablets together, then 1 every day until finished. 09/21/17   Muthersbaugh, Dahlia Client, PA-C  benzonatate (TESSALON PERLES) 100 MG capsule Take 1 capsule (100 mg total) by mouth 3 (three) times daily as needed for cough (cough). 09/21/17   Muthersbaugh, Dahlia Client, PA-C  EPINEPHrine 0.3 mg/0.3 mL IJ SOAJ injection Inject 0.3 mLs (0.3 mg total) into the muscle as needed (anaphylaxis). 05/24/20   Marquetta Weiskopf, Veryl Speak, PA-C  fluticasone (FLONASE) 50 MCG/ACT nasal spray Place 2 sprays into both nostrils daily. 09/21/17   Muthersbaugh, Dahlia Client, PA-C  naproxen sodium (ALEVE) 220 MG tablet Take 220 mg by mouth daily as needed (pain).    [provider]  predniSONE (DELTASONE) 10 MG tablet Take 6 tablets (60 mg total) by mouth daily for 1 day, THEN 4 tablets (40 mg total) daily for 3 days. 05/24/20 05/28/20  Vonnie Ligman, Veryl Speak, PA-C    Family History Family History  Problem Relation Age of Onset  . Hypertension Mother   . Breast cancer Mother 35  . Healthy Father     Social History Social History   Tobacco Use  . Smoking status: Former Smoker    Types: Cigarettes    Quit date: 2018    Years since quitting: 3.6  . Smokeless tobacco: Never Used  Substance Use Topics  . Alcohol use: Yes    Alcohol/week: 2.0 standard drinks    Types: 2 Cans of beer per week  . Drug use: Yes    Types: Marijuana     Allergies   Bee pollen   Review of Systems Review of Systems   Physical Exam Triage Vital Signs ED Triage Vitals [05/24/20 1308]  Enc Vitals Group     BP (!) 145/82     Pulse Rate 72     Resp 19     Temp 98.1 F (36.7 C)     Temp src      SpO2 100 %     Weight      Height  Head Circumference      Peak Flow      Pain Score 0     Pain Loc      Pain Edu?      Excl. in GC?    No data found.  Updated Vital Signs BP (!) 145/82   Pulse 72   Temp 98.1 F (36.7 C)   Resp 19   SpO2 100%   Visual Acuity Right Eye Distance:   Left Eye Distance:   Bilateral Distance:    Right Eye Near:   Left Eye Near:    Bilateral Near:     Physical Exam Vitals and nursing note reviewed.  Constitutional:      Appearance: He is well-developed. He is not ill-appearing.  HENT:     Head: Normocephalic and atraumatic.     Mouth/Throat:     Mouth: Mucous membranes are moist.     Comments: No angioedema or oral pharyngeal swelling. Eyes:     Conjunctiva/sclera: Conjunctivae normal.  Cardiovascular:     Rate and Rhythm: Normal rate and regular rhythm.     Heart sounds: No murmur heard.   Pulmonary:     Effort: Pulmonary effort is normal. No respiratory distress.     Breath sounds: Normal breath sounds. No  wheezing, rhonchi or rales.  Abdominal:     Palpations: Abdomen is soft.     Tenderness: There is no abdominal tenderness.  Musculoskeletal:     Cervical back: Neck supple.  Skin:    General: Skin is warm and dry.     Comments: Small area of erythema on the right lateral back consistent with bee sting or bite.  There is no other rash throughout the entire body.  Neurological:     Mental Status: He is alert.      UC Treatments / Results  Labs (all labs ordered are listed, but only abnormal results are displayed) Labs Reviewed - No data to display  EKG   Radiology No results found.  Procedures Procedures (including critical care time)  Medications Ordered in UC Medications - No data to display  Initial Impression / Assessment and Plan / UC Course  I have reviewed the triage vital signs and the nursing notes.  Pertinent labs & imaging results that were available during my care of the patient were reviewed by me and considered in my medical decision making (see chart for details).     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  No evidence of acute allergic reaction.  Given history of serious anaphylaxis we will do a short course of prednisone to prevent delayed reaction.  Will refill EpiPen.  Instructed patient follow-up with his primary care.  Strict emergency department precautions discussed.  Patient verbalized understand plan of care Final Clinical Impressions(s) / UC Diagnoses   Final diagnoses:  Bee sting, accidental or unintentional, initial encounter  History of allergic reaction     Discharge Instructions     Take the prednisone  Take an additional dose of benadryl in about 2-3 hours  I have refilled the epipen, if you have shortness of breath, throat closing sensation , use Epipen and Call 911       ED Prescriptions    Medication Sig  Dispense Auth. Provider   EPINEPHrine 0.3 mg/0.3 mL IJ SOAJ injection Inject 0.3 mLs (0.3 mg total) into the muscle as needed (anaphylaxis). 2 each Kamil Mchaffie,  Veryl Speak, PA-C   predniSONE (DELTASONE) 10 MG tablet Take 6 tablets (60 mg total) by mouth daily for 1 day, THEN 4 tablets (40 mg total) daily for 3 days. 18 tablet Brigg Cape, Veryl Speak, PA-C     PDMP not reviewed this encounter.   Hermelinda Medicus, PA-C 05/24/20 2105

## 2020-06-19 ENCOUNTER — Other Ambulatory Visit: Payer: Self-pay

## 2020-06-19 ENCOUNTER — Ambulatory Visit
Admission: EM | Admit: 2020-06-19 | Discharge: 2020-06-19 | Disposition: A | Discharge status: Home / Self Care | Payer: BC Managed Care – PPO | Attending: Emergency Medicine | Admitting: Emergency Medicine

## 2020-06-19 DIAGNOSIS — L02419 Cutaneous abscess of limb, unspecified: Secondary | ICD-10-CM

## 2020-06-19 MED ORDER — SULFAMETHOXAZOLE-TRIMETHOPRIM 800-160 MG PO TABS: 1.0000 | ORAL_TABLET | Freq: Two times a day (BID) | ORAL | 0 refills | Status: AC

## 2020-06-19 NOTE — ED Triage Notes (Signed)
Pt states he has an abscess or cyst on his right underarm that has been worsening over the last week. Pt states there is very slight drainage. Pt states that palpation and arm movements cause pain. Pt is aox4 and ambulatory.

## 2020-06-19 NOTE — ED Provider Notes (Signed)
Emergency Department Provider Note  ____________________________________________  Time seen: Approximately 1:21 PM  I have reviewed the triage vital signs and the nursing notes.   HISTORY  Chief Complaint Abscess (worsening x 1 week)   Historian Patient     HPI Jose Rangel is a 44 y.o. male presents to the emergency department with a right-sided axillary abscess that is spontaneously draining.  Patient has had symptoms for the past 2 to 3 days.  He has a history of cutaneous abscesses.  Patient states that he has never required incision and drainage in the past.  No fever or chills at home.  No other alleviating measures have been attempted.   Past Medical History:  Diagnosis Date  . Bee sting allergy      Immunizations up to date:  Yes.     Past Medical History:  Diagnosis Date  . Bee sting allergy     Patient Active Problem List   Diagnosis Date Noted  . 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 ANTERIOR CRUCIATE LIGAMENT REPAIR    . HERNIA REPAIR      Prior to Admission medications   Medication Sig Start Date End Date Taking? Authorizing Provider  azithromycin (ZITHROMAX) 250 MG tablet Take 1 tablet (250 mg total) by mouth daily. Take first 2 tablets together, then 1 every day until finished. 09/21/17   Muthersbaugh, Dahlia Client, PA-C  benzonatate (TESSALON PERLES) 100 MG capsule Take 1 capsule (100 mg total) by mouth 3 (three) times daily as needed for cough (cough). 09/21/17   Muthersbaugh, Dahlia Client, PA-C  EPINEPHrine 0.3 mg/0.3 mL IJ SOAJ injection Inject 0.3 mLs (0.3 mg total) into the muscle as needed (anaphylaxis). 05/24/20   Darr, Veryl Speak, PA-C  fluticasone (FLONASE) 50 MCG/ACT nasal spray Place 2 sprays into both nostrils daily. 09/21/17   Muthersbaugh, Dahlia Client, PA-C  naproxen sodium (ALEVE) 220 MG tablet Take 220 mg by mouth daily as needed (pain).    [provider]  sulfamethoxazole-trimethoprim  (BACTRIM DS) 800-160 MG tablet Take 1 tablet by mouth 2 (two) times daily for 7 days. 06/19/20 06/26/20  Orvil Feil, PA-C    Allergies Bee pollen  Family History  Problem Relation Age of Onset  . Hypertension Mother   . Breast cancer Mother 51  . Healthy Father     Social History Social History   Tobacco Use  . Smoking status: Former Smoker    Types: Cigarettes    Quit date: 2018    Years since quitting: 3.7  . Smokeless tobacco: Never Used  Vaping Use  . Vaping Use: Never used  Substance Use Topics  . Alcohol use: Yes    Alcohol/week: 2.0 standard drinks    Types: 2 Cans of beer per week  . Drug use: Yes    Types: Marijuana     Review of Systems  Constitutional: No fever/chills Eyes:  No discharge ENT: No upper respiratory complaints. Respiratory: no cough. No SOB/ use of accessory muscles to breath Gastrointestinal:   No nausea, no vomiting.  No diarrhea.  No constipation. Musculoskeletal: Negative for musculoskeletal pain. Skin: Patient has abscess.    ____________________________________________   PHYSICAL EXAM:  VITAL SIGNS: ED Triage Vitals  Enc Vitals Group     BP 06/19/20 1315 (!) 142/85     Pulse Rate 06/19/20 1315 78     Resp 06/19/20 1315 18     Temp 06/19/20 1315 98.1 F (36.7 C)  Temp Source 06/19/20 1315 Oral     SpO2 06/19/20 1315 98 %     Weight --      Height --      Head Circumference --      Peak Flow --      Pain Score 06/19/20 1317 5     Pain Loc --      Pain Edu? --      Excl. in GC? --      Constitutional: Alert and oriented. Well appearing and in no acute distress. Eyes: Conjunctivae are normal. PERRL. EOMI. Head: Atraumatic. Cardiovascular: Normal rate, regular rhythm. Normal S1 and S2.  Good peripheral circulation. Respiratory: Normal respiratory effort without tachypnea or retractions. Lungs CTAB. Good air entry to the bases with no decreased or absent breath sounds Gastrointestinal: Bowel sounds x 4 quadrants.  Soft and nontender to palpation. No guarding or rigidity. No distention. Musculoskeletal: Full range of motion to all extremities. No obvious deformities noted Neurologic:  Normal for age. No gross focal neurologic deficits are appreciated.  Skin: Patient has a 1.5 cm x 1.5 cm right axillary abscess that is spontaneously draining. Psychiatric: Mood and affect are normal for age. Speech and behavior are normal.   ____________________________________________   LABS (all labs ordered are listed, but only abnormal results are displayed)  Labs Reviewed - No data to display ____________________________________________  EKG   ____________________________________________  RADIOLOGY   No results found.  ____________________________________________    PROCEDURES  Procedure(s) performed:     Procedures     Medications - No data to display   ____________________________________________   INITIAL IMPRESSION / ASSESSMENT AND PLAN / ED COURSE  Pertinent labs & imaging results that were available during my care of the patient were reviewed by me and considered in my medical decision making (see chart for details).      Assessment and Plan:  Axillary abscess 44 year old male presents to the emergency department with a spontaneously draining right axillary abscess.  Vital signs were reassuring at triage.  Incision and drainage is not warranted at this time as abscess is spontaneously draining.  Patient was discharged with Bactrim to be taken twice daily over the next 7 days.  Return precautions were given to return with new or worsening symptoms.    ____________________________________________  FINAL CLINICAL IMPRESSION(S) / ED DIAGNOSES  Final diagnoses:  Axillary abscess      NEW MEDICATIONS STARTED DURING THIS VISIT:  ED Discharge Orders         Ordered    sulfamethoxazole-trimethoprim (BACTRIM DS) 800-160 MG tablet  2 times daily        06/19/20 1319               This chart was dictated using voice recognition software/Dragon. Despite best efforts to proofread, errors can occur which can change the meaning. Any change was purely unintentional.     Orvil Feil, PA-C 06/19/20 1323

## 2020-06-19 NOTE — Discharge Instructions (Signed)
Take Bactrim twice daily for the next seven days.  

## 2020-07-01 ENCOUNTER — Other Ambulatory Visit: Payer: BC Managed Care – PPO

## 2020-07-01 DIAGNOSIS — Z20822 Contact with and (suspected) exposure to covid-19: Secondary | ICD-10-CM

## 2020-07-02 LAB — SARS-COV-2, NAA 2 DAY TAT

## 2020-07-02 LAB — NOVEL CORONAVIRUS, NAA: SARS-CoV-2, NAA: NOT DETECTED

## 2020-09-23 ENCOUNTER — Ambulatory Visit
Admission: EM | Admit: 2020-09-23 | Discharge: 2020-09-23 | Disposition: A | Discharge status: Home / Self Care | Payer: BC Managed Care – PPO | Attending: Emergency Medicine | Admitting: Emergency Medicine

## 2020-09-23 ENCOUNTER — Other Ambulatory Visit: Payer: Self-pay

## 2020-09-23 DIAGNOSIS — J069 Acute upper respiratory infection, unspecified: Secondary | ICD-10-CM

## 2020-09-23 MED ORDER — BENZONATATE 200 MG PO CAPS: 200.0000 mg | ORAL_CAPSULE | Freq: Three times a day (TID) | ORAL | 0 refills | Status: AC | PRN

## 2020-09-23 MED ORDER — CETIRIZINE HCL 10 MG PO CAPS: 10.0000 mg | ORAL_CAPSULE | Freq: Every day | ORAL | 0 refills | Status: DC

## 2020-09-23 NOTE — Discharge Instructions (Signed)
COVID test pending, monitor my chart for resutls Continue flonase, add in certirizine/zyrtec for congesiton and draiange Tessalon for cough Rest and fluids Follow up if not improving or worsening

## 2020-09-23 NOTE — ED Triage Notes (Signed)
Pt presents to Urgent Care with c/o "scratchy, irritated" throat since yesterday. Suspects sinus infection. Pt w/ no known COVID exposure; has not been vaccinated.

## 2020-09-23 NOTE — ED Provider Notes (Signed)
[facility]    CSN: [id number] Arrival date & time: 09/23/20  0944      History   Chief Complaint Chief Complaint  Patient presents with   Sore Throat    HPI Jose Rangel is a 44 y.o. male presenting today for evaluation of sore throat. Reports it is very irritated. Reports also had a typical sinus drainage related to temperature changes. Denies any fevers chills or body aches. 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 Authorizing Provider  benzonatate (TESSALON) 200 MG capsule Take 1 capsule (200 mg total) by mouth 3 (three) times daily as needed for up to 7 days for cough. 09/23/20 09/30/20 Yes Briar Witherspoon C, PA-C  Cetirizine HCl 10 MG CAPS Take 1 capsule (10 mg total) by mouth daily for 15 days. 09/23/20 10/08/20 Yes Nura Cahoon C, PA-C  EPINEPHrine 0.3 mg/0.3 mL IJ SOAJ injection Inject 0.3 mLs (0.3 mg total) into the muscle as needed (anaphylaxis). 05/24/20   Darr, Gerilyn Pilgrim, PA-C  fluticasone (FLONASE) 50 MCG/ACT nasal spray Place 2 sprays into both nostrils daily. 09/21/17   Muthersbaugh, Dahlia Client, PA-C  naproxen sodium (ALEVE) 220 MG tablet Take 220 mg by mouth daily as needed (pain).    [provider]    Family History Family History  Problem Relation Age of Onset   Hypertension Mother    Breast cancer Mother 66   Healthy Father     Social History Social History   Tobacco Use   Smoking status: Former Smoker    Types: Cigarettes    Quit date: 2018    Years since quitting: 3.9   Smokeless tobacco: Never Used  Building services engineer Use: Never used  Substance Use  Topics   Alcohol use: Yes    Comment: occasional   Drug use: Yes    Types: Marijuana    Comment: occasional     Allergies   Bee pollen   Review of Systems Review of Systems  Constitutional: Negative for activity change, appetite change, chills, fatigue and fever.  HENT: Positive for congestion and sore throat. Negative for ear pain, rhinorrhea, sinus pressure and trouble swallowing.   Eyes: Negative for discharge and redness.  Respiratory: Negative for cough, chest tightness and shortness of breath.   Cardiovascular: Negative for chest pain.  Gastrointestinal: Negative for abdominal pain, diarrhea, nausea and vomiting.  Musculoskeletal: Negative for myalgias.  Skin: Negative for rash.  Neurological: Negative for dizziness, light-headedness and headaches.     Physical Exam Triage Vital Signs ED Triage Vitals  Enc Vitals Group     BP --      Pulse --      Resp --      Temp --      Temp src --      SpO2 --      Weight 09/23/20 1203 145 lb (65.8 kg)     Height 09/23/20 1203 5\' 8"  (1.727 m)     Head Circumference --      Peak Flow --  Pain Score 09/23/20 1202 2     Pain Loc --      Pain Edu? --      Excl. in GC? --    No data found.  Updated Vital Signs BP (!) 135/93 (BP Location: Right Arm)    Pulse 66    Temp 98.1 F (36.7 C) (Oral)    Resp 18    Ht 5\' 8"  (1.727 m)    Wt 145 lb (65.8 kg)    SpO2 96%    BMI 22.05 kg/m   Visual Acuity Right Eye Distance:   Left Eye Distance:   Bilateral Distance:    Right Eye Near:   Left Eye Near:    Bilateral Near:     Physical Exam Vitals and nursing note reviewed.  Constitutional:      Appearance: He is well-developed and well-nourished.     Comments: No acute distress  HENT:     Head: Normocephalic and atraumatic.     Ears:     Comments: Bilateral ears without tenderness to palpation of external auricle, tragus and mastoid, EAC's without erythema or swelling, TM's with good bony landmarks and cone of light.  Non erythematous.     Nose: Nose normal.     Mouth/Throat:     Comments: Oral mucosa pink and moist, no tonsillar enlargement or exudate. Posterior pharynx patent and nonerythematous, no uvula deviation or swelling. Normal phonation. Eyes:     Conjunctiva/sclera: Conjunctivae normal.  Cardiovascular:     Rate and Rhythm: Normal rate.  Pulmonary:     Effort: Pulmonary effort is normal. No respiratory distress.     Comments: Breathing comfortably at rest, CTABL, no wheezing, rales or other adventitious sounds auscultated Abdominal:     General: There is no distension.  Musculoskeletal:        General: Normal range of motion.     Cervical back: Neck supple.  Skin:    General: Skin is warm and dry.  Neurological:     Mental Status: He is alert and oriented to person, place, and time.  Psychiatric:        Mood and Affect: Mood and affect normal.      UC Treatments / Results  Labs (all labs ordered are listed, but only abnormal results are displayed) Labs Reviewed  NOVEL CORONAVIRUS, NAA    EKG   Radiology No results found.  Procedures Procedures (including critical care time)  Medications Ordered in UC Medications - No data to display  Initial Impression / Assessment and Plan / UC Course  I have reviewed the triage vital signs and the nursing notes.  Pertinent labs & imaging results that were available during my care of the patient were reviewed by me and considered in my medical decision making (see chart for details).     Covid test pending, treating symptomatically and supportively for likely viral etiology and recommending symptomatic and supportive care. Continue Flonase, add in daily antihistamine. Rest and fluids. Continue to monitor,Discussed strict return precautions. Patient verbalized understanding and is agreeable with plan.  Final Clinical Impressions(s) / UC Diagnoses   Final diagnoses:  Viral URI with cough     Discharge Instructions     COVID  test pending, monitor my chart for resutls Continue flonase, add in certirizine/zyrtec for congesiton and draiange Tessalon for cough Rest and fluids Follow up if not improving or worsening    ED Prescriptions    Medication Sig Dispense Auth. Provider   benzonatate (TESSALON) 200 MG  capsule Take 1 capsule (200 mg total) by mouth 3 (three) times daily as needed for up to 7 days for cough. 28 capsule Marlia Schewe C, PA-C   Cetirizine HCl 10 MG CAPS Take 1 capsule (10 mg total) by mouth daily for 15 days. 15 capsule Kathleen Likins, [place] C, PA-C     PDMP not reviewed this encounter.   Lew Dawes, PA-C 09/23/20 1238

## 2020-09-24 LAB — NOVEL CORONAVIRUS, NAA: SARS-CoV-2, NAA: NOT DETECTED

## 2020-09-24 LAB — SARS-COV-2, NAA 2 DAY TAT

## 2021-09-01 ENCOUNTER — Other Ambulatory Visit: Payer: Self-pay

## 2021-09-01 ENCOUNTER — Emergency Department (HOSPITAL_COMMUNITY)
Admission: EM | Admit: 2021-09-01 | Discharge: 2021-09-02 | Disposition: A | Discharge status: Home / Self Care | Payer: BC Managed Care – PPO | Attending: Emergency Medicine | Admitting: Emergency Medicine

## 2021-09-01 DIAGNOSIS — J101 Influenza due to other identified influenza virus with other respiratory manifestations: Secondary | ICD-10-CM | POA: Diagnosis not present

## 2021-09-01 DIAGNOSIS — Z20822 Contact with and (suspected) exposure to covid-19: Secondary | ICD-10-CM | POA: Insufficient documentation

## 2021-09-01 DIAGNOSIS — R509 Fever, unspecified: Secondary | ICD-10-CM | POA: Diagnosis present

## 2021-09-01 DIAGNOSIS — Z87891 Personal history of nicotine dependence: Secondary | ICD-10-CM | POA: Diagnosis not present

## 2021-09-01 LAB — RESP PANEL BY RT-PCR (FLU A&B, COVID) ARPGX2
Influenza A by PCR: POSITIVE — AB
Influenza B by PCR: NEGATIVE
SARS Coronavirus 2 by RT PCR: NEGATIVE

## 2021-09-01 MED ORDER — ACETAMINOPHEN 325 MG PO TABS
650.0000 mg | ORAL_TABLET | Freq: Once | ORAL | Status: AC
  Administered 2021-09-01: 650 mg via ORAL
  Filled 2021-09-01: qty 2

## 2021-09-01 NOTE — ED Triage Notes (Signed)
Pt here for cough, chills and runny nose. Pt stated that s/sx started last night. Pt stated that he's been coughing up clear sputum. Pt has no c/o SOB at this time or no c/o fevers.

## 2021-09-01 NOTE — ED Provider Notes (Signed)
Emergency Medicine Provider Triage Evaluation Note  Jose Rangel , a 45 y.o. male  was evaluated in triage.  Pt complains of right leg symptoms that started yesterday.  Patient complains of cough, chills, runny nose, fever.  He has been taking over-the-counter medications without relief.  He has not taken anything specifically for fever.  He reports that his son was recently in the ED and diagnosed with an ear infection.  He believes that his son was tested for COVID, flu, RSV and he believes he is negative however he is uncertain.   Review of Systems  Positive: + cough, congestion, rhinorrhea, fever Negative: - SOB  Physical Exam  BP (!) 141/87   Pulse 87   Temp (!) 100.9 F (38.3 C) (Oral)   Resp 18   Ht 5\' 8"  (1.727 m)   Wt 65.8 kg   SpO2 98%   BMI 22.05 kg/m  Gen:   Awake, no distress   Resp:  Normal effort  MSK:   Moves extremities without difficulty  Other:    Medical Decision Making  Medically screening exam initiated at 9:01 PM.  Appropriate orders placed.  Jose Rangel was informed that the remainder of the evaluation will be completed by another provider, this initial triage assessment does not replace that evaluation, and the importance of remaining in the ED until their evaluation is complete.     Jose Deter, PA-C 09/01/21 2101    2102, MD 09/01/21 2138

## 2021-09-02 MED ORDER — IBUPROFEN 800 MG PO TABS
800.0000 mg | ORAL_TABLET | Freq: Once | ORAL | Status: AC
  Administered 2021-09-02: 800 mg via ORAL
  Filled 2021-09-02: qty 1

## 2021-09-02 NOTE — ED Provider Notes (Signed)
[facility] EMERGENCY DEPARTMENT Provider Note   CSN: [id number] Arrival date & time: 09/01/21  2026     History Chief Complaint  Patient presents with   Cough   Chills    Jose Rangel is a 45 y.o. male.  45 year old male presents with complaint of fevers, chills, cough, body aches, congestion and sore throat onset Tuesday, exposed to his infant son who has tested positive for the flu.  Patient is otherwise healthy, no history of asthma or chronic lung disease.  No other complaints or concerns tonight.      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    Past Surgical History:  Procedure Laterality Date   ANTERIOR CRUCIATE LIGAMENT REPAIR     HERNIA REPAIR         Family History  Problem Relation Age of Onset   Hypertension Mother    Breast cancer Mother 78   Healthy Father     Social History   Tobacco Use   Smoking status: Former    Types: Cigarettes    Quit date: 2018    Years since quitting: 4.9   Smokeless tobacco: Never  Vaping Use   Vaping Use: Never used  Substance Use Topics   Alcohol use: Yes    Comment: occasional   Drug use: Yes    Types: Marijuana    Comment: occasional    Home Medications Prior to Admission medications   Medication Sig Start Date End Date Taking? Authorizing Provider  Cetirizine HCl 10 MG CAPS Take 1 capsule (10 mg total) by mouth daily for 15 days. 09/23/20 10/08/20  Wieters, Hallie C, PA-C  EPINEPHrine 0.3 mg/0.3 mL IJ SOAJ injection Inject 0.3 mLs (0.3 mg total) into the muscle as needed (anaphylaxis). 05/24/20   Darr, Gerilyn Pilgrim, PA-C  fluticasone (FLONASE) 50 MCG/ACT nasal spray Place 2 sprays into both nostrils daily. 09/21/17   Muthersbaugh, Dahlia Client, PA-C  naproxen sodium (ALEVE) 220 MG tablet Take 220 mg by mouth daily as needed (pain).    [provider]    Allergies    Bee  pollen  Review of Systems   Review of Systems  Constitutional:  Positive for chills, diaphoresis and fever.  HENT:  Positive for congestion and sore throat. Negative for ear pain.   Respiratory:  Positive for cough.   Gastrointestinal:  Negative for diarrhea, nausea and vomiting.  Musculoskeletal:  Positive for arthralgias and myalgias.  Skin:  Negative for rash and wound.  Allergic/Immunologic: Negative for immunocompromised state.  Neurological:  Negative for weakness and headaches.  Hematological:  Negative for adenopathy.  Psychiatric/Behavioral:  Negative for confusion.   All other systems reviewed and are negative.  Physical Exam Updated Vital Signs BP 123/68 (BP Location: Left Arm)   Pulse 89   Temp (!) 102.4 F (39.1 C) (Oral)   Resp 16   Ht 5\' 8"  (1.727 m)   Wt 65.8 kg   SpO2 100%   BMI 22.05 kg/m   Physical Exam Vitals and nursing note reviewed.  Constitutional:      General: He is not in acute distress.    Appearance: He is well-developed. He is not diaphoretic.  HENT:     Head: Normocephalic and atraumatic.     Right Ear: Tympanic membrane and ear canal normal.     Left Ear: Tympanic membrane and ear  canal normal.     Nose: Congestion present.     Mouth/Throat:     Mouth: Mucous membranes are moist.     Pharynx: No oropharyngeal exudate or posterior oropharyngeal erythema.  Eyes:     Conjunctiva/sclera: Conjunctivae normal.  Cardiovascular:     Rate and Rhythm: Normal rate and regular rhythm.     Heart sounds: Normal heart sounds.  Pulmonary:     Effort: Pulmonary effort is normal.     Breath sounds: Normal breath sounds.  Musculoskeletal:     Cervical back: Neck supple.  Lymphadenopathy:     Cervical: No cervical adenopathy.  Skin:    General: Skin is warm and dry.     Findings: No erythema or rash.  Neurological:     Mental Status: He is alert and oriented to person, place, and time.  Psychiatric:        Behavior: Behavior normal.    ED  Results / Procedures / Treatments   Labs (all labs ordered are listed, but only abnormal results are displayed) Labs Reviewed  RESP PANEL BY RT-PCR (FLU A&B, COVID) ARPGX2 - Abnormal; Notable for the following components:      Result Value   Influenza A by PCR POSITIVE (*)    All other components within normal limits    EKG None  Radiology No results found.  Procedures Procedures   Medications Ordered in ED Medications  ibuprofen (ADVIL) tablet 800 mg (has no administration in time range)  acetaminophen (TYLENOL) tablet 650 mg (650 mg Oral Given 09/01/21 2114)    ED Course  I have reviewed the triage vital signs and the nursing notes.  Pertinent labs & imaging results that were available during my care of the patient were reviewed by me and considered in my medical decision making (see chart for details).  Clinical Course as of 09/02/21 0529  Thu Sep 02, 2021  [id number] 45 year old male presents with flulike symptoms onset Tuesday night after exposure to his infant son who is also tested positive for flu.  Patient is otherwise well-appearing.  On recheck in triage, he is a temperature of 102.4, he is given ibuprofen prior to discharge.  His father's are otherwise reassuring including O2 sat 100% room air.  Patient is tested positive for influenza A.  Recommend symptomatic treatment at home.  Follow-up with PCP as needed, return to ED for severe concerning symptoms. [LM]    Clinical Course User Index [LM] Alden Hipp   MDM Rules/Calculators/A&P                           Final Clinical Impression(s) / ED Diagnoses Final diagnoses:  Influenza A    Rx / DC Orders ED Discharge Orders     None        Jeannie Fend, PA-C 09/02/21 0529    Sabas Sous, MD 09/02/21 [phone]

## 2021-09-02 NOTE — Discharge Instructions (Signed)
Motrin and Tylenol as needed as directed for your symptoms.  Follow-up with your doctor as needed.  You may return to work when you are fever free for 24 hours without requiring fever reducing medications.

## 2022-02-22 IMAGING — CT CT L SPINE W/O CM
3 series · 10 of 33 positions shown, 12 images · IV contrast (Omni 300)
Comparison: No pertinent prior studies available for comparison.

CLINICAL DATA: Abdominal pain, diffuse. Additional provided:

EXAM:
CT LUMBAR SPINE WITHOUT CONTRAST
TECHNIQUE: Multidetector CT imaging of the lumbar spine was performed without
intravenous contrast administration. Multiplanar CT image
reconstructions were also generated.

[Series 3: l-spine bone · axial · 0.35mm/px · z∈[-237,-117]mm · 2 of 87 slices shown, 3 images]
[im 27/87  soft-tissue]
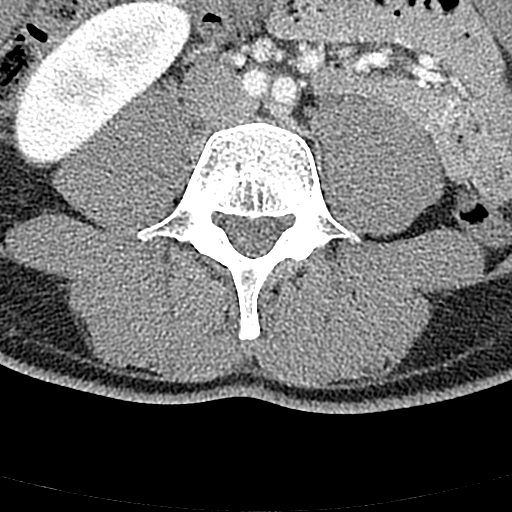
[im 27/87  bone]
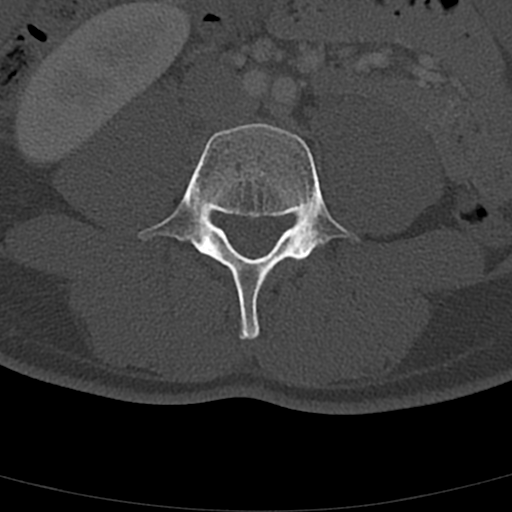
[im 67/87  bone]
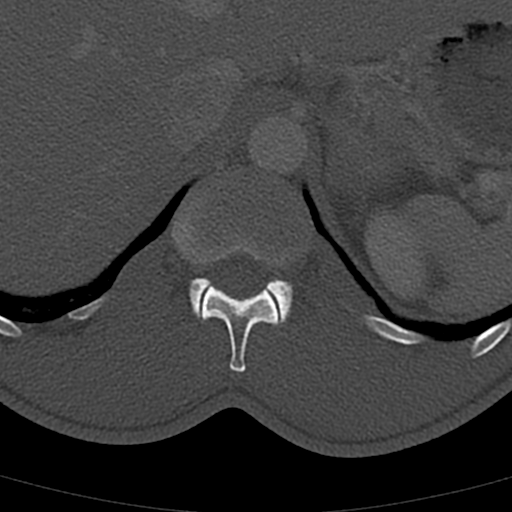

[Series 4: l-spine bone cor · coronal · 0.35mm/px · 3 of 45 slices shown]
[im 9/45  bone]
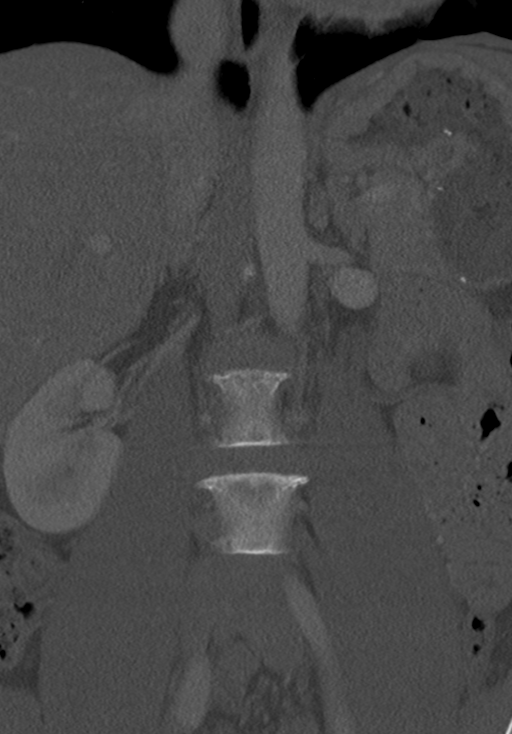
[im 18/45  bone]
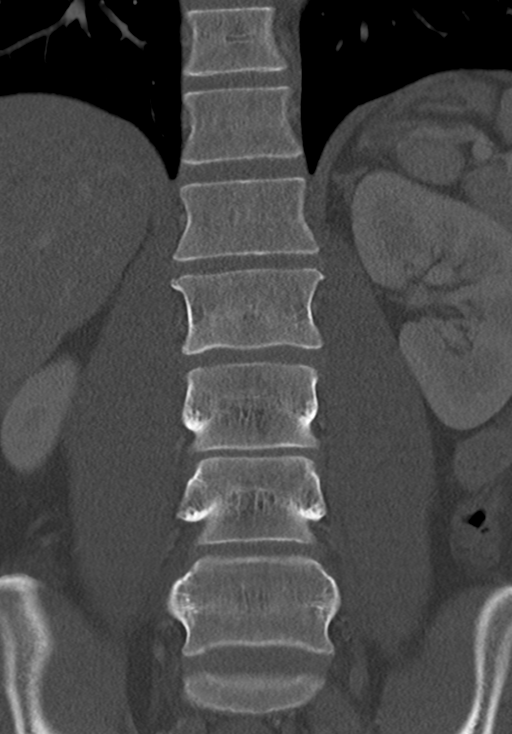
[im 27/45  bone]
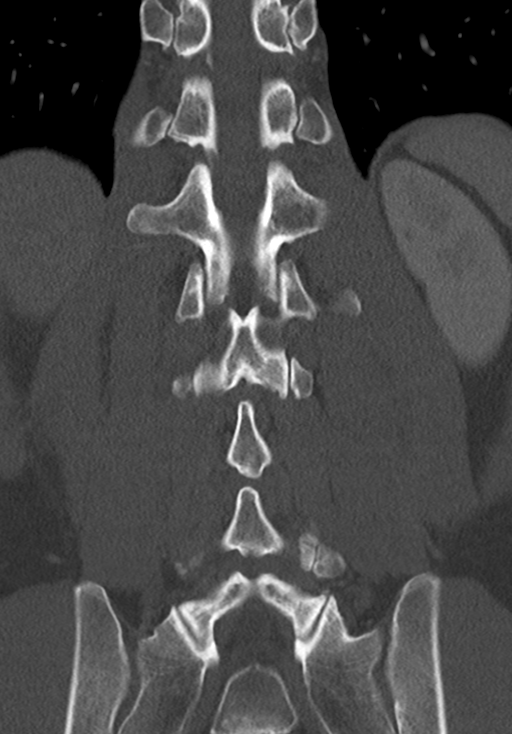

[Series 5: l-spine bone sag · sagittal · 0.26mm/px · 5 of 61 slices shown, 6 images]
[im 21/61  bone]
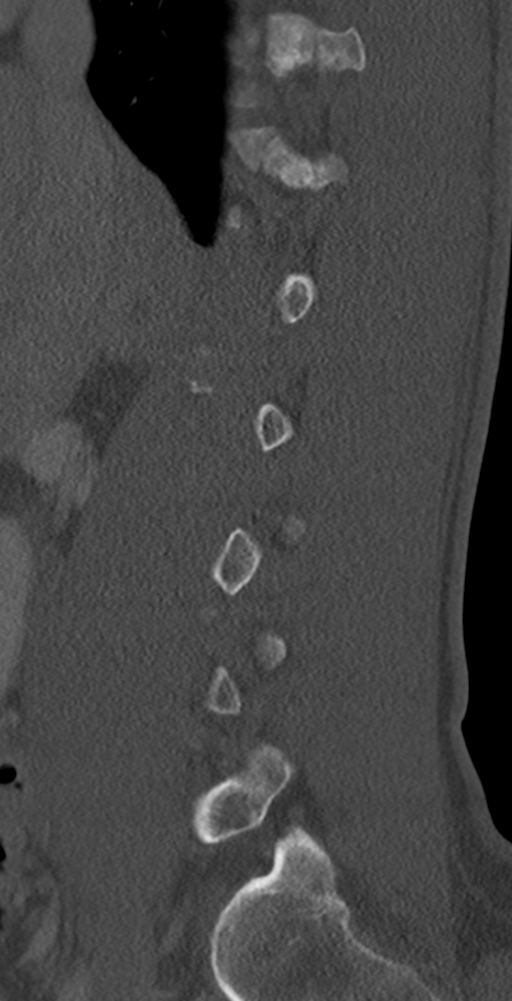
[im 26/61  bone]
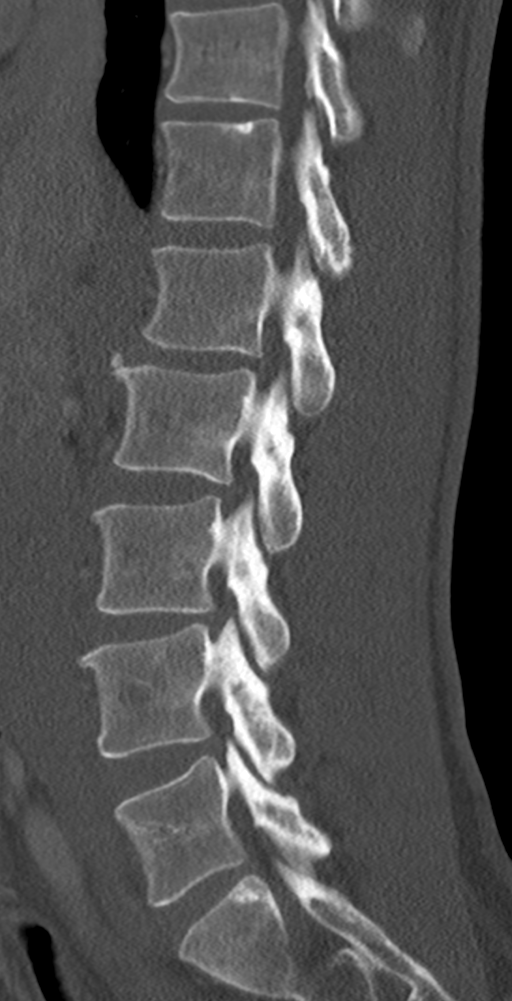
[im 31/61  soft-tissue]
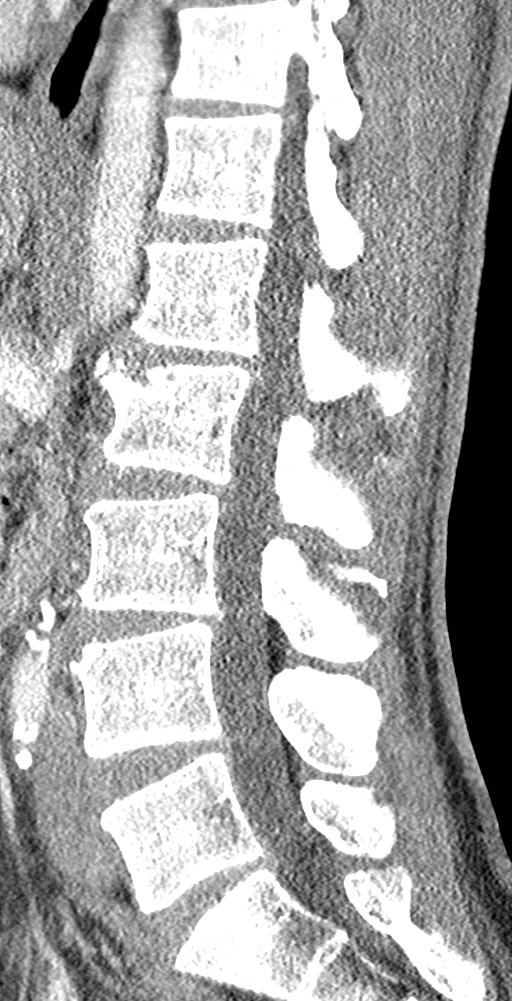
[im 31/61  bone]
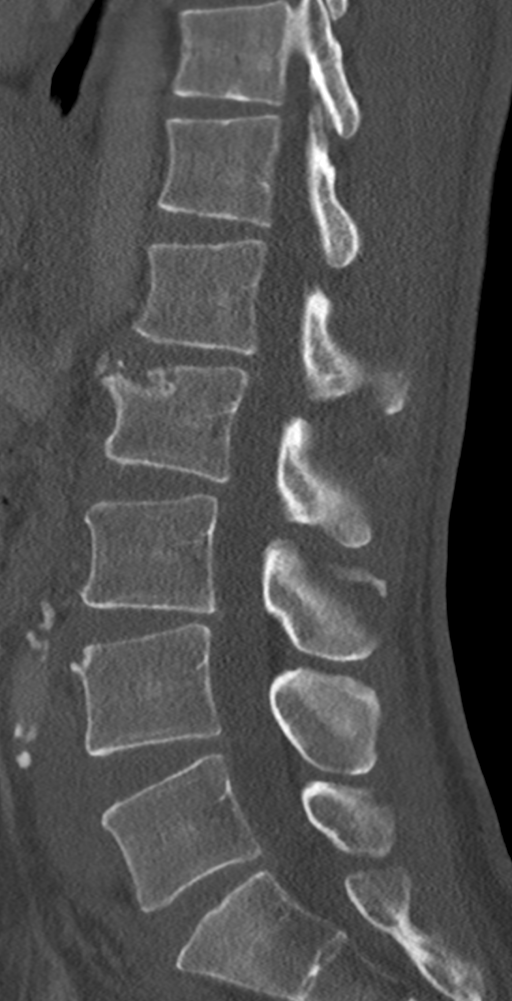
[im 36/61  bone]
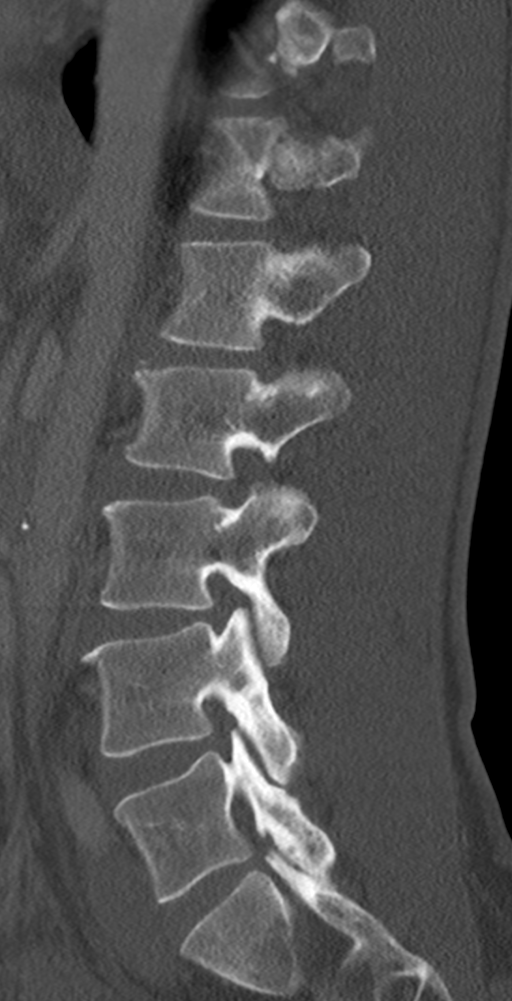
[im 41/61  bone]
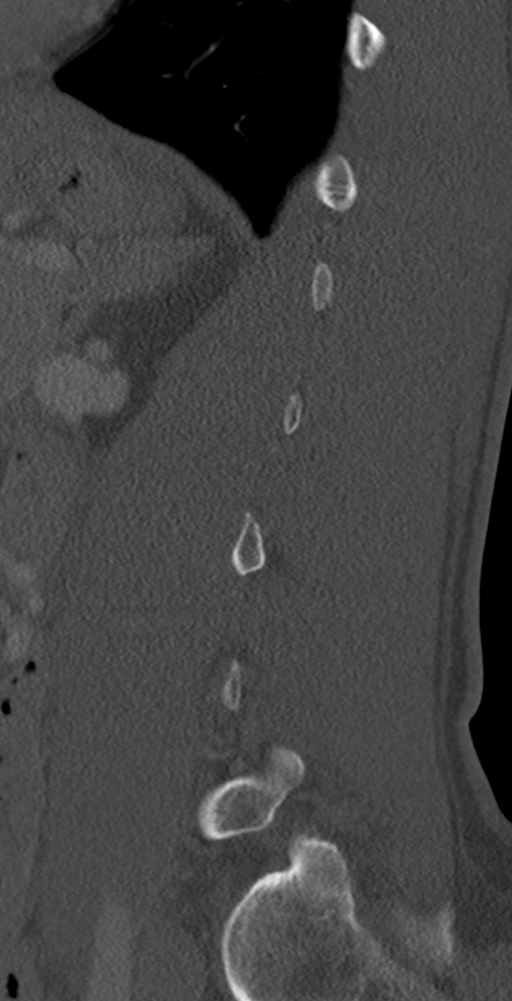

[10 of 33 positions shown; findings below may reference images not displayed]

FINDINGS: Segmentation: For the purposes of this dictation, five lumbar
vertebrae are assumed and the caudal most well-formed intervertebral
disc is designated L5-S1.

Alignment: Mild L2-L3, L3-L4 and L4-L5 grade 1 retrolisthesis.

Vertebrae: There is an age-indeterminate deformity of the L2
superior endplate anteriorly. This has a rounded appearance and is
favored to reflect a Schmorl node. An age-indeterminate fracture
cannot be definitively excluded. Vertebral body height is otherwise
maintained. No evidence of acute fracture to the lumbar spine
elsewhere. Small Schmorl nodes are present at T11-T12 and within the
S1 superior endplate.

Paraspinal and other soft tissues: Please refer to separately
reported CT abdomen/pelvis for soft tissue findings. The paraspinal
soft tissues are unremarkable.

Disc levels:

There is mild multilevel disc space narrowing. Additionally, there
are small multilevel disc bulges with mild endplate spurring and
multilevel mild facet arthrosis. No high-grade bony spinal canal
stenosis is appreciable. Multilevel neural foraminal narrowing,
greatest bilaterally at L2-L3, L3-L4 and L4-L5.
IMPRESSION: Age-indeterminate deformity of the L2 superior endplate anteriorly.
This has a rounded appearance and is favored to reflect a Schmorl
node. An age-indeterminate compression fracture cannot be
definitively excluded.

No evidence of acute fracture elsewhere within the lumbar spine.

Lumbar spondylosis as described. Mild multilevel grade 1
retrolisthesis.

Please refer to CT abdomen/pelvis separately reported for soft
tissue findings.
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
## 2022-02-22 IMAGING — CT CT ABD-PELV W/ CM
2 of 5 series · 16 of 46 positions shown, 18 images · IV contrast (Omni 300)
Comparison: None

CLINICAL DATA: Abdominal trauma, hematuria

EXAM:
CT ABDOMEN AND PELVIS WITH CONTRAST
TECHNIQUE: Multidetector CT imaging of the abdomen and pelvis was performed
using the standard protocol following bolus administration of
intravenous contrast.
CONTRAST:  100mL OMNIPAQUE IOHEXOL 300 MG/ML  SOLN

[Series 1: a/p w/ 5mm · axial · 0.62mm/px · z∈[-446,-61]mm · 13 of 87 slices shown, 15 images]
[im 5/87  soft-tissue]
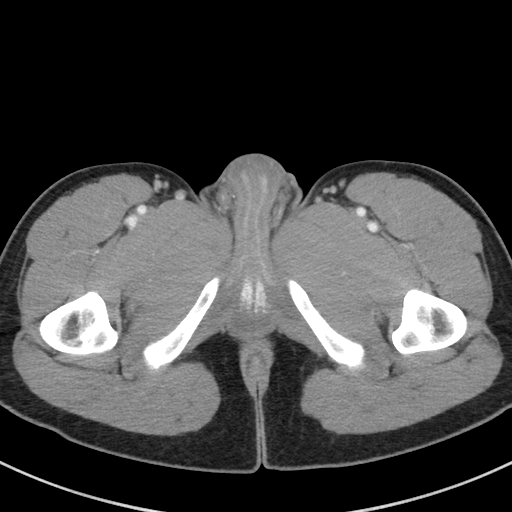
[im 5/87  bone]
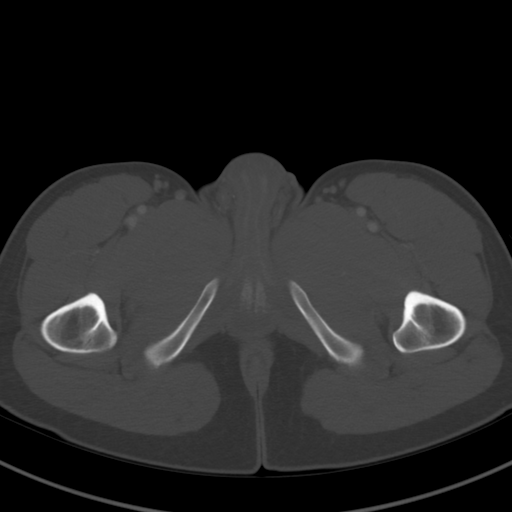
[im 10/87  soft-tissue]
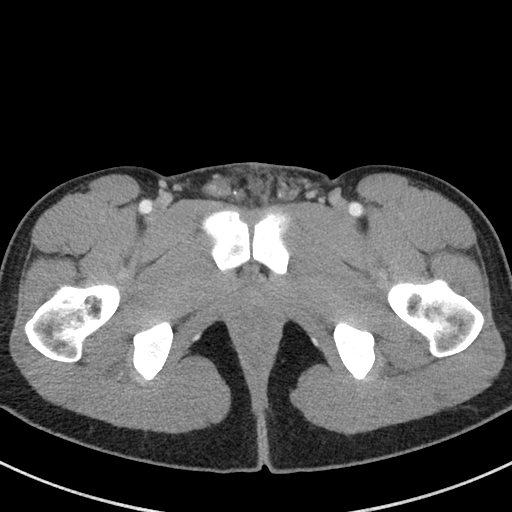
[im 20/87  soft-tissue]
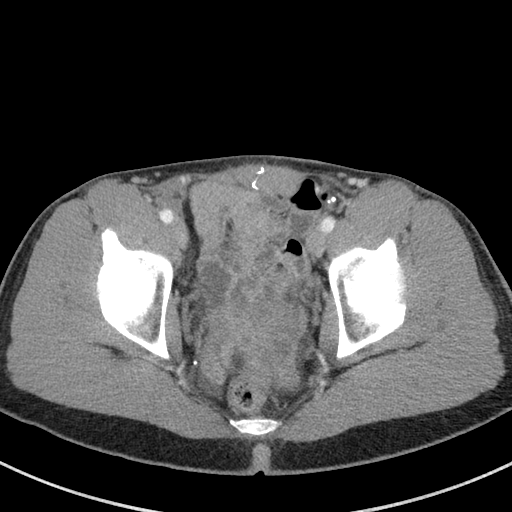
[im 24/87  soft-tissue]
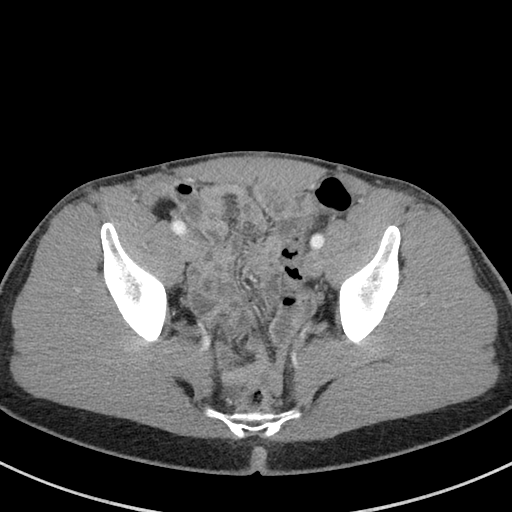
[im 29/87  soft-tissue]
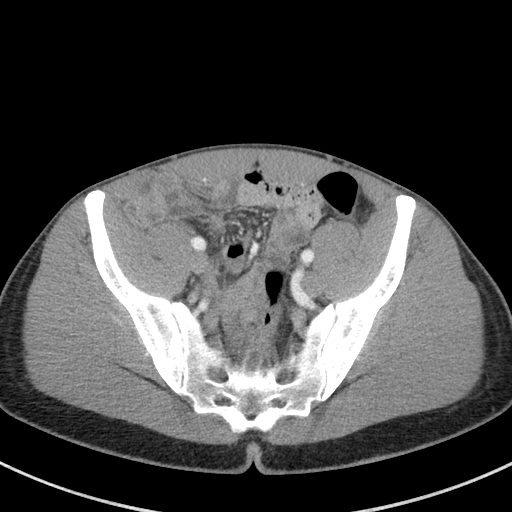
[im 39/87  soft-tissue]
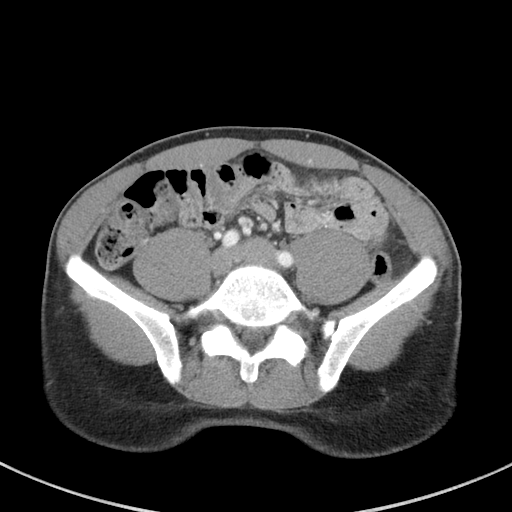
[im 44/87  soft-tissue]
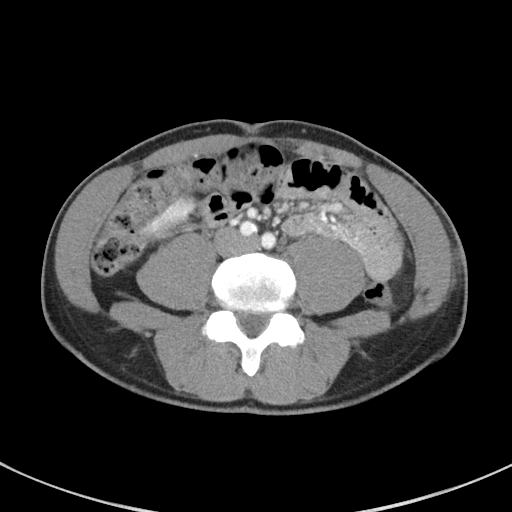
[im 48/87  soft-tissue]
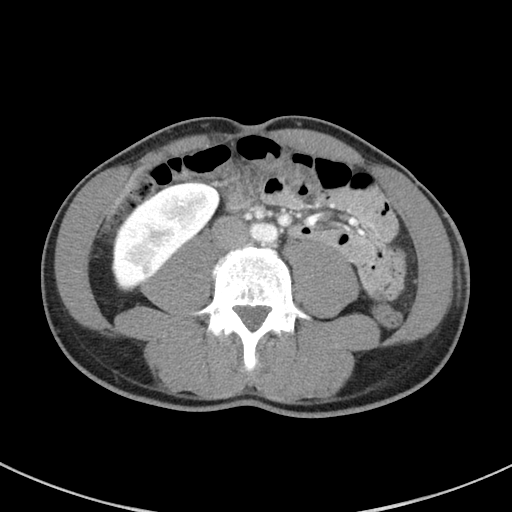
[im 58/87  soft-tissue]
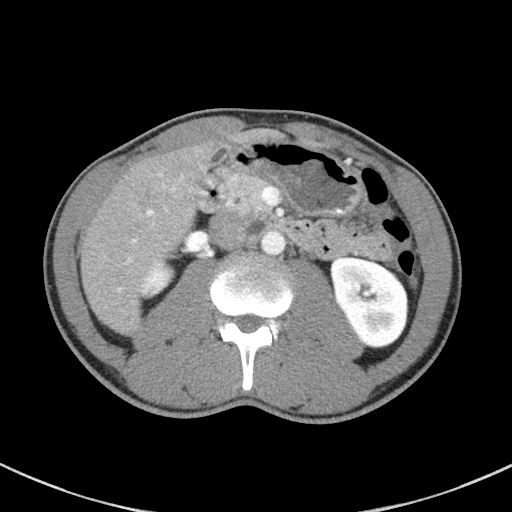
[im 58/87  bone]
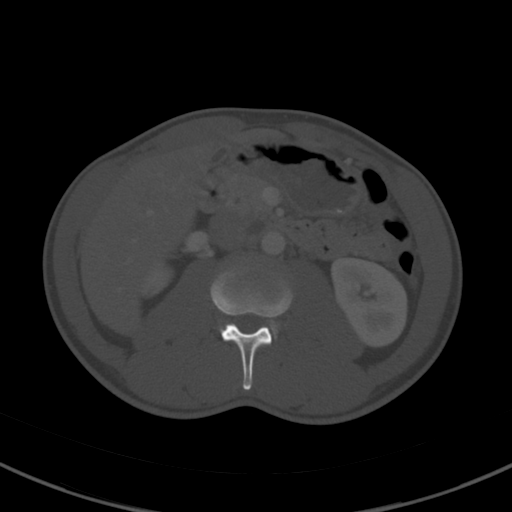
[im 63/87  soft-tissue]
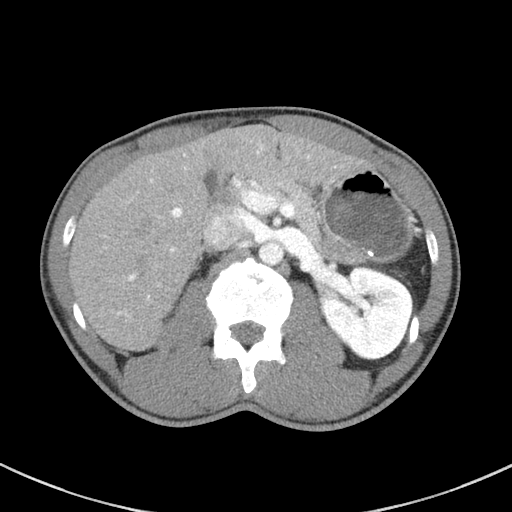
[im 67/87  soft-tissue]
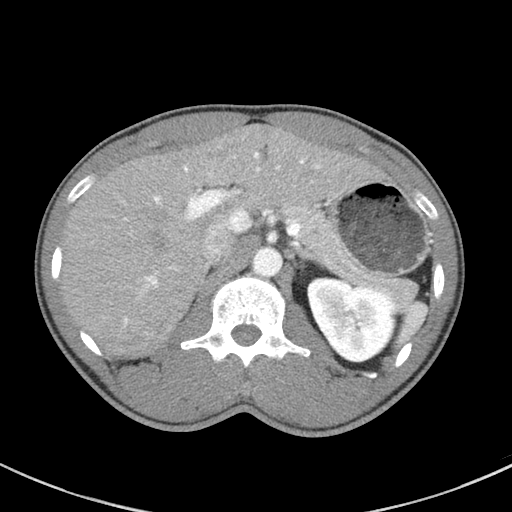
[im 77/87  soft-tissue]
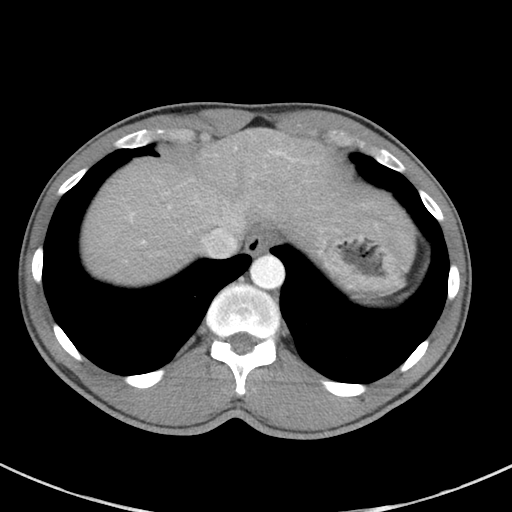
[im 82/87  soft-tissue]
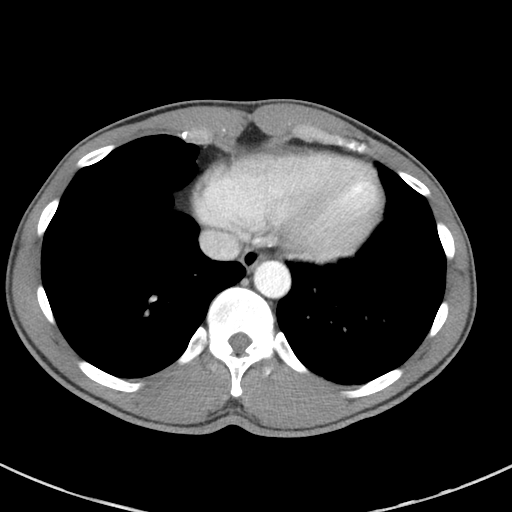

[Series 6: a/p w/ cor · coronal · 0.62mm/px · 3 of 128 slices shown]
[im 43/128  soft-tissue]
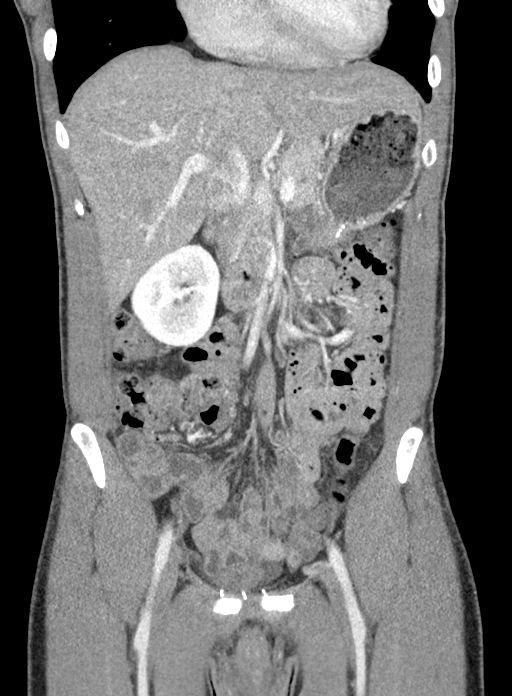
[im 57/128  soft-tissue]
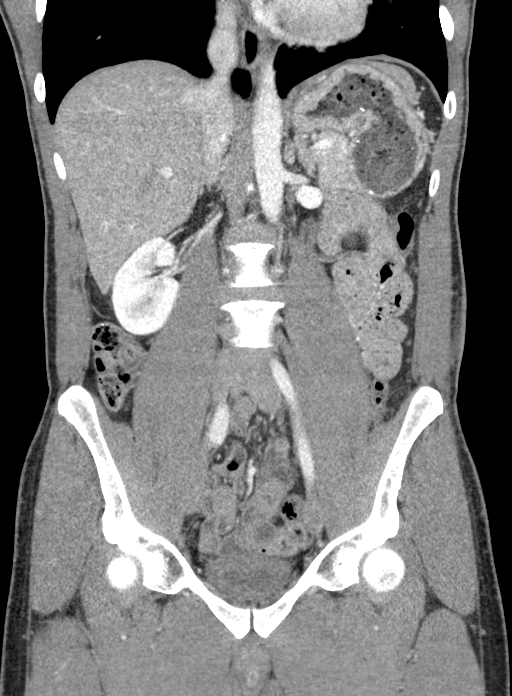
[im 71/128  soft-tissue]
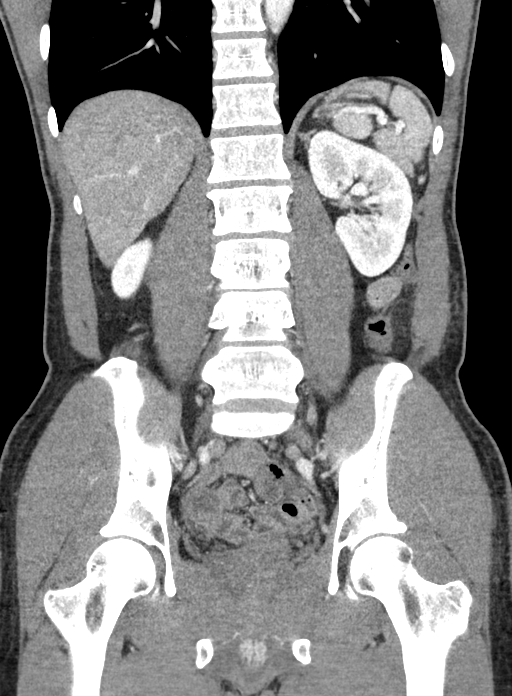

[16 of 46 positions shown; findings below may reference images not displayed]

FINDINGS: Lower chest: Lung bases are clear. No effusions. Heart is normal
size.

Hepatobiliary: No hepatic injury or perihepatic hematoma.
Gallbladder is unremarkable, contracted.

Pancreas: No focal abnormality or ductal dilatation.

Spleen: No splenic injury or perisplenic hematoma.

Adrenals/Urinary Tract: No adrenal hemorrhage or renal injury
identified. Bladder is unremarkable. Small cyst in the lower pole of
the right kidney. No hydronephrosis.

Stomach/Bowel: Stomach, large and small bowel grossly unremarkable.

Vascular/Lymphatic: No evidence of aneurysm or adenopathy.

Reproductive: No visible focal abnormality.

Other: No free fluid or free air.

Musculoskeletal: No acute bony abnormality. Limbus vertebrae noted
along the superior aspect of L2.
IMPRESSION: No acute findings or evidence of significant traumatic injury in the
abdomen or pelvis.

## 2022-04-14 ENCOUNTER — Other Ambulatory Visit: Payer: Self-pay

## 2022-04-14 ENCOUNTER — Encounter: Payer: Self-pay | Admitting: Emergency Medicine

## 2022-04-14 ENCOUNTER — Ambulatory Visit
Admission: EM | Admit: 2022-04-14 | Discharge: 2022-04-14 | Disposition: A | Discharge status: Home / Self Care | Payer: BC Managed Care – PPO | Attending: Internal Medicine | Admitting: Internal Medicine

## 2022-04-14 DIAGNOSIS — M546 Pain in thoracic spine: Secondary | ICD-10-CM

## 2022-04-14 MED ORDER — IBUPROFEN 600 MG PO TABS: 600.0000 mg | ORAL_TABLET | Freq: Four times a day (QID) | ORAL | 0 refills | Status: DC | PRN

## 2022-04-14 MED ORDER — CYCLOBENZAPRINE HCL 5 MG PO TABS: 5.0000 mg | ORAL_TABLET | Freq: Two times a day (BID) | ORAL | 0 refills | Status: DC | PRN

## 2022-04-14 NOTE — Discharge Instructions (Signed)
It appears that you have a muscle strain.  This is being treated with a muscle relaxer and ibuprofen.  Please be advised that muscle relaxer can cause drowsiness so do not drive while taking.  Also alternate ice and heat to affected area of back.  Please follow-up if symptoms persist or worsen.

## 2022-04-14 NOTE — ED Provider Notes (Signed)
[facility]    CSN: [id number] Arrival date & time: 04/14/22  0936      History   Chief Complaint Chief Complaint  Patient presents with   Back Pain    HPI Jose Rangel is a 46 y.o. male.   Patient presents with right upper back pain that started about 4 days ago.  Denies any obvious injury.  Patient reports that he has had intermittent back pain in that area over the years given history of sports injuries.  Denies any numbness or tingling.  Pain does not radiate.  Movement exacerbates pain.  He has not taken any medication for pain.   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 Authorizing Provider  cyclobenzaprine (FLEXERIL) 5 MG tablet Take 1 tablet (5 mg total) by mouth 2 (two) times daily as needed for muscle spasms. 04/14/22  Yes Denaya Horn, Rolly Salter E, FNP  ibuprofen (ADVIL) 600 MG tablet Take 1 tablet (600 mg total) by mouth every 6 (six) hours as needed for mild pain. 04/14/22  Yes Saraih Lorton, Rolly Salter E, FNP  Cetirizine HCl 10 MG CAPS Take 1 capsule (10 mg total) by mouth daily for 15 days. 09/23/20 10/08/20  Wieters, Hallie C, PA-C  EPINEPHrine 0.3 mg/0.3 mL IJ SOAJ injection Inject 0.3 mLs (0.3 mg total) into the muscle as needed (anaphylaxis). 05/24/20   Darr, Gerilyn Pilgrim, PA-C  fluticasone (FLONASE) 50 MCG/ACT nasal spray Place 2 sprays into both nostrils daily. 09/21/17   Muthersbaugh, Dahlia Client, PA-C    Family History Family History  Problem Relation Age of Onset   Hypertension Mother    Breast cancer Mother 80   Healthy Father     Social History Social History   Tobacco Use   Smoking status: Former    Types: Cigarettes     Quit date: 2018    Years since quitting: 5.5   Smokeless tobacco: Never  Vaping Use   Vaping Use: Never used  Substance Use Topics   Alcohol use: Yes    Comment: occasional   Drug use: Yes    Types: Marijuana    Comment: occasional     Allergies   Bee pollen   Review of Systems Review of Systems Per HPI  Physical Exam Triage Vital Signs ED Triage Vitals [04/14/22 0947]  Enc Vitals Group     BP 124/79     Pulse Rate 79     Resp 18     Temp 98.3 F (36.8 C)     Temp Source Oral     SpO2 97 %     Weight      Height      Head Circumference      Peak Flow      Pain Score 7     Pain Loc      Pain Edu?      Excl. in GC?    No data found.  Updated Vital Signs BP 124/79 (BP Location: Left Arm)   Pulse 79   Temp 98.3 F (36.8 C) (Oral)   Resp 18  SpO2 97%   Visual Acuity Right Eye Distance:   Left Eye Distance:   Bilateral Distance:    Right Eye Near:   Left Eye Near:    Bilateral Near:     Physical Exam Constitutional:      General: He is not in acute distress.    Appearance: Normal appearance. He is not toxic-appearing or diaphoretic.  HENT:     Head: Normocephalic and atraumatic.  Eyes:     Extraocular Movements: Extraocular movements intact.     Conjunctiva/sclera: Conjunctivae normal.  Pulmonary:     Effort: Pulmonary effort is normal.  Musculoskeletal:       Back:     Comments: Tenderness to palpation to right upper thoracic area.  No direct spinal tenderness, crepitus, step-off, swelling, discoloration, warmth, lacerations, abrasions noted.  Grip strength 5/5.  Neurological:     General: No focal deficit present.     Mental Status: He is alert and oriented to person, place, and time. Mental status is at baseline.  Psychiatric:        Mood and Affect: Mood normal.        Behavior: Behavior normal.        Thought Content: Thought content normal.        Judgment: Judgment normal.      UC Treatments / Results  Labs (all labs ordered  are listed, but only abnormal results are displayed) Labs Reviewed - No data to display  EKG   Radiology No results found.  Procedures Procedures (including critical care time)  Medications Ordered in UC Medications - No data to display  Initial Impression / Assessment and Plan / UC Course  I have reviewed the triage vital signs and the nursing notes.  Pertinent labs & imaging results that were available during my care of the patient were reviewed by me and considered in my medical decision making (see chart for details).     Physical exam is consistent with muscle strain.  Will treat with muscle relaxer and NSAIDs.  Do not think imaging of the back is necessary given no obvious injury or direct spinal tenderness.  Advised patient that muscle relaxer can cause drowsiness and do not drive while taking this medication.  Discussed supportive care and alternating ice and heat to affected area.  Discussed return precautions.  Patient verbalized understanding and was agreeable with plan. Final Clinical Impressions(s) / UC Diagnoses   Final diagnoses:  Acute right-sided thoracic back pain     Discharge Instructions      It appears that you have a muscle strain.  This is being treated with a muscle relaxer and ibuprofen.  Please be advised that muscle relaxer can cause drowsiness so do not drive while taking.  Also alternate ice and heat to affected area of back.  Please follow-up if symptoms persist or worsen.    ED Prescriptions     Medication Sig Dispense Auth. Provider   ibuprofen (ADVIL) 600 MG tablet Take 1 tablet (600 mg total) by mouth every 6 (six) hours as needed for mild pain. 30 tablet [place], [place] E, [place]   cyclobenzaprine (FLEXERIL) 5 MG tablet Take 1 tablet (5 mg total) by mouth 2 (two) times daily as needed for muscle spasms. 20 tablet [place], [place] E, [place]      I have reviewed the PDMP during this encounter.   Gustavus Bryant, [place] 04/14/22 1013

## 2022-04-14 NOTE — ED Triage Notes (Signed)
Pt here for right sided mid to upper back pain x 4 days

## 2022-05-12 ENCOUNTER — Emergency Department (HOSPITAL_COMMUNITY): Payer: BC Managed Care – PPO

## 2022-05-12 ENCOUNTER — Encounter: Payer: Self-pay | Admitting: Emergency Medicine

## 2022-05-12 ENCOUNTER — Ambulatory Visit
Admission: EM | Admit: 2022-05-12 | Discharge: 2022-05-12 | Disposition: A | Discharge status: Home / Self Care | Payer: BC Managed Care – PPO

## 2022-05-12 ENCOUNTER — Emergency Department (HOSPITAL_COMMUNITY)
Admission: EM | Admit: 2022-05-12 | Discharge: 2022-05-12 | Disposition: A | Discharge status: Home / Self Care | Payer: BC Managed Care – PPO | Attending: Emergency Medicine | Admitting: Emergency Medicine

## 2022-05-12 ENCOUNTER — Other Ambulatory Visit: Payer: Self-pay

## 2022-05-12 ENCOUNTER — Encounter (HOSPITAL_COMMUNITY): Payer: Self-pay | Admitting: Emergency Medicine

## 2022-05-12 DIAGNOSIS — N454 Abscess of epididymis or testis: Secondary | ICD-10-CM | POA: Diagnosis not present

## 2022-05-12 DIAGNOSIS — L0291 Cutaneous abscess, unspecified: Secondary | ICD-10-CM

## 2022-05-12 DIAGNOSIS — L02214 Cutaneous abscess of groin: Secondary | ICD-10-CM | POA: Diagnosis present

## 2022-05-12 LAB — URINALYSIS, ROUTINE W REFLEX MICROSCOPIC
Bacteria, UA: NONE SEEN
Bilirubin Urine: NEGATIVE
Glucose, UA: NEGATIVE mg/dL
Hgb urine dipstick: NEGATIVE
Ketones, ur: NEGATIVE mg/dL
Nitrite: NEGATIVE
Protein, ur: NEGATIVE mg/dL
Specific Gravity, Urine: 1.025 (ref 1.005–1.030)
pH: 6 (ref 5.0–8.0)

## 2022-05-12 LAB — BASIC METABOLIC PANEL
Anion gap: 7 (ref 5–15)
BUN: 13 mg/dL (ref 6–20)
CO2: 27 mmol/L (ref 22–32)
Calcium: 9.2 mg/dL (ref 8.9–10.3)
Chloride: 105 mmol/L (ref 98–111)
Creatinine, Ser: 1.05 mg/dL (ref 0.61–1.24)
GFR, Estimated: >60 mL/min (ref >60)
Glucose, Bld: 102 mg/dL — ABNORMAL HIGH (ref 70–99)
Potassium: 4.2 mmol/L (ref 3.5–5.1)
Sodium: 139 mmol/L (ref 135–145)

## 2022-05-12 LAB — CBC
HCT: 42 % (ref 39.0–52.0)
Hemoglobin: 13.3 g/dL (ref 13.0–17.0)
MCH: 27.4 pg (ref 26.0–34.0)
MCHC: 31.7 g/dL (ref 30.0–36.0)
MCV: 86.6 fL (ref 80.0–100.0)
Platelets: 289 10*3/uL (ref 150–400)
RBC: 4.85 MIL/uL (ref 4.22–5.81)
RDW: 14.1 % (ref 11.5–15.5)
WBC: 4.2 10*3/uL (ref 4.0–10.5)
nRBC: 0 % (ref 0.0–0.2)

## 2022-05-12 MED ORDER — DOXYCYCLINE HYCLATE 50 MG PO CAPS: 50.0000 mg | ORAL_CAPSULE | Freq: Two times a day (BID) | ORAL | 0 refills | Status: AC

## 2022-05-12 MED ORDER — LIDOCAINE-EPINEPHRINE 1 %-1:100000 IJ SOLN
20.0000 mL | Freq: Once | INTRAMUSCULAR | Status: AC
  Administered 2022-05-12: 20 mL
  Filled 2022-05-12: qty 1

## 2022-05-12 MED ORDER — DOXYCYCLINE HYCLATE 100 MG PO TABS
200.0000 mg | ORAL_TABLET | Freq: Once | ORAL | Status: AC
  Administered 2022-05-12: 200 mg via ORAL
  Filled 2022-05-12: qty 2

## 2022-05-12 NOTE — ED Provider Triage Note (Signed)
Emergency Medicine Provider Triage Evaluation Note  Jose Rangel , a 46 y.o. male  was evaluated in triage.  Pt complains of abscess in his groin area for the past week.  Patient reports that it has been draining.  Went to urgent care sent him over here for an ultrasound to rule out any deep space infection.  Denies any fevers, dysuria, or hematuria..  Review of Systems  Positive:  Negative:   Physical Exam  BP (!) 138/93 (BP Location: Right Arm)   Pulse 75   Temp 98.7 F (37.1 C) (Oral)   Resp 16   SpO2 98%  Gen:   Awake, no distress   Resp:  Normal effort  MSK:   Moves extremities without difficulty  Other:  Approximately 4 cm x 2 cm area of induration with central pustule on to the right wall of the scrotum/base of the penis.  Irving Burton, RN as chaperone.  Medical Decision Making  Medically screening exam initiated at 12:51 PM.  Appropriate orders placed.  JASPAL PULTZ was informed that the remainder of the evaluation will be completed by another provider, this initial triage assessment does not replace that evaluation, and the importance of remaining in the ED until their evaluation is complete.  Ultrasound ordered as well as basic lab work.   Achille Rich, PA-C 05/12/22 1252

## 2022-05-12 NOTE — ED Provider Notes (Signed)
[facility]    CSN: [id number] Arrival date & time: 05/12/22  5852      History   Chief Complaint Chief Complaint  Patient presents with   Abscess    HPI LAMOINE MAGALLON is a 46 y.o. male.   Patient presents with possible abscess to right groin that has been present for about 3 days.  He states that it started draining about 2 to 3 days ago.  Patient does report history of abscesses in various areas of his body but has never had one in his groin.  Denies fever, body aches, chills.   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 Authorizing Provider  Cetirizine HCl 10 MG CAPS Take 1 capsule (10 mg total) by mouth daily for 15 days. 09/23/20 10/08/20  Wieters, Hallie C, PA-C  cyclobenzaprine (FLEXERIL) 5 MG tablet Take 1 tablet (5 mg total) by mouth 2 (two) times daily as needed for muscle spasms. 04/14/22   Gustavus Bryant, FNP  EPINEPHrine 0.3 mg/0.3 mL IJ SOAJ injection Inject 0.3 mLs (0.3 mg total) into the muscle as needed (anaphylaxis). 05/24/20   Darr, Gerilyn Pilgrim, PA-C  fluticasone (FLONASE) 50 MCG/ACT nasal spray Place 2 sprays into both nostrils daily. 09/21/17   Muthersbaugh, Dahlia Client, PA-C  ibuprofen (ADVIL) 600 MG tablet Take 1 tablet (600 mg total) by mouth every 6 (six) hours as needed for mild pain. 04/14/22   Gustavus Bryant, FNP    Family History Family History  Problem Relation Age of Onset   Hypertension Mother    Breast cancer Mother 44   Healthy Father     Social History Social History   Tobacco Use   Smoking status: Former    Types: Cigarettes    Quit date: 2018    Years since quitting: 5.6   Smokeless  tobacco: Never  Vaping Use   Vaping Use: Never used  Substance Use Topics   Alcohol use: Yes    Comment: occasional   Drug use: Yes    Types: Marijuana    Comment: occasional     Allergies   Bee venom   Review of Systems Review of Systems Per HPI  Physical Exam Triage Vital Signs ED Triage Vitals  Enc Vitals Group     BP 05/12/22 1046 (!) 147/89     Pulse Rate 05/12/22 1046 74     Resp 05/12/22 1046 18     Temp 05/12/22 1046 98.2 F (36.8 C)     Temp src --      SpO2 05/12/22 1046 97 %     Weight --      Height --      Head Circumference --      Peak Flow --      Pain Score 05/12/22 1045 0     Pain Loc --      Pain Edu? --      Excl. in GC? --    No data found.  Updated Vital Signs BP (!) 147/89   Pulse 74   Temp 98.2 F (36.8  C)   Resp 18   SpO2 97%   Visual Acuity Right Eye Distance:   Left Eye Distance:   Bilateral Distance:    Right Eye Near:   Left Eye Near:    Bilateral Near:     Physical Exam Exam conducted with a chaperone present.  Constitutional:      General: He is not in acute distress.    Appearance: Normal appearance. He is not toxic-appearing or diaphoretic.  HENT:     Head: Normocephalic and atraumatic.  Eyes:     Extraocular Movements: Extraocular movements intact.     Conjunctiva/sclera: Conjunctivae normal.  Pulmonary:     Effort: Pulmonary effort is normal.  Genitourinary:    Testes: Cremasteric reflex is present.       Comments: Area of induration that is approximately 2.5 to 3 cm in diameter present to right upper testicle.  Mild purulent drainage noted. Neurological:     General: No focal deficit present.     Mental Status: He is alert and oriented to person, place, and time. Mental status is at baseline.  Psychiatric:        Mood and Affect: Mood normal.        Behavior: Behavior normal.        Thought Content: Thought content normal.        Judgment: Judgment normal.      UC Treatments / Results   Labs (all labs ordered are listed, but only abnormal results are displayed) Labs Reviewed - No data to display  EKG   Radiology No results found.  Procedures Procedures (including critical care time)  Medications Ordered in UC Medications - No data to display  Initial Impression / Assessment and Plan / UC Course  I have reviewed the triage vital signs and the nursing notes.  Pertinent labs & imaging results that were available during my care of the patient were reviewed by me and considered in my medical decision making (see chart for details).     Given the abscess is located at testicle, I do think that ultrasound may be warranted to ensure no more extensive involvement.  Do not have ultrasound capabilities here in urgent care so patient was referred to the ER.  Patient was agreeable to going to the ER.  Will defer antibiotics to ER after more extensive evaluation as well.  Patient verbalized understanding and was agreeable plan.  Vital signs stable at discharge.  Agree with patient self transport to the hospital. Final Clinical Impressions(s) / UC Diagnoses   Final diagnoses:  Testicular abscess     Discharge Instructions      Go to the emergency department as soon as you leave urgent care as I feel that you probably need an ultrasound of your testicle to ensure no more extensive involvement.    ED Prescriptions   None    PDMP not reviewed this encounter.   Gustavus Bryant, [place] 05/12/22 1132

## 2022-05-12 NOTE — ED Notes (Signed)
Discharge instructions given and explained to patient. Patient stated no questions comments or concerns and verbalized understanding. Work note was provided. Pt was ambulatory upon departure.

## 2022-05-12 NOTE — ED Provider Notes (Signed)
[facility] EMERGENCY DEPARTMENT Provider Note   CSN: [id number] Arrival date & time: 05/12/22  1136     History  Chief Complaint  Patient presents with   Abscess    Jose Rangel is a 46 y.o. male who presents with 2 days of tender swelling and drainage from right groin. First noticed after shaving. Thought it was just a hair bump, but then it progressed and became large with drainage. Not associated with fevers. Has felt otherwise well, which is why he didn't seek care until today. Medical history notable for frequent boils on skin, but this is the first time he's had one in his groin. Has never taken antibiotics for them before.   Abscess      Home Medications Prior to Admission medications   Medication Sig Start Date End Date Taking? Authorizing Provider  doxycycline (VIBRAMYCIN) 50 MG capsule Take 1 capsule (50 mg total) by mouth 2 (two) times daily for 6 days. 05/12/22 05/18/22 Yes Marrianne Mood, MD  Cetirizine HCl 10 MG CAPS Take 1 capsule (10 mg total) by mouth daily for 15 days. 09/23/20 10/08/20  Wieters, Hallie C, PA-C  cyclobenzaprine (FLEXERIL) 5 MG tablet Take 1 tablet (5 mg total) by mouth 2 (two) times daily as needed for muscle spasms. 04/14/22   Gustavus Bryant, FNP  EPINEPHrine 0.3 mg/0.3 mL IJ SOAJ injection Inject 0.3 mLs (0.3 mg total) into the muscle as needed (anaphylaxis). 05/24/20   Darr, Gerilyn Pilgrim, PA-C  fluticasone (FLONASE) 50 MCG/ACT nasal spray Place 2 sprays into both nostrils daily. 09/21/17   Muthersbaugh, Dahlia Client, PA-C  ibuprofen (ADVIL) 600 MG tablet Take 1 tablet (600 mg total) by mouth every 6 (six) hours as needed for mild pain. 04/14/22   Gustavus Bryant, FNP      Allergies    Bee venom    Review of Systems   Review of Systems  All other systems reviewed and are negative.   Physical Exam Updated Vital Signs BP (!) 138/93 (BP Location: Right Arm)   Pulse 75   Temp 98.7 F (37.1 C) (Oral)   Resp 16   SpO2 98%  Physical  Exam Exam conducted with a chaperone present.  Pulmonary:     Effort: Pulmonary effort is normal.  Genitourinary:    Penis: Normal.      Testes: Normal.     Comments: Indurated lesion with purulent drainage R groin Skin:    General: Skin is warm and dry.  Neurological:     Mental Status: He is alert.  Psychiatric:        Mood and Affect: Mood normal.     ED Results / Procedures / Treatments   Labs (all labs ordered are listed, but only abnormal results are displayed) Labs Reviewed  BASIC METABOLIC PANEL - Abnormal; Notable for the following components:      Result Value   Glucose, Bld 102 (*)    All other components within normal limits  URINALYSIS, ROUTINE W REFLEX MICROSCOPIC - Abnormal; Notable for the following components:   Leukocytes,Ua TRACE (*)    All other components within normal limits  CBC    EKG None  Radiology US SCROTUM W/DOPPLER  Result Date: 05/12/2022 CLINICAL DATA:  Abscess on right scrotal wall EXAM: SCROTAL ULTRASOUND DOPPLER ULTRASOUND OF THE TESTICLES TECHNIQUE: Complete ultrasound examination of the testicles, epididymis, and other scrotal structures was performed. Color and spectral Doppler ultrasound were also utilized to evaluate blood flow to the testicles. COMPARISON:  None Available. FINDINGS: Right testicle Measurements: 4.1 x 2.1 x 3.7 cm. No mass or microlithiasis visualized. Left testicle Measurements: 4.1 x 3.5 x 3.1 cm. No mass or microlithiasis visualized. Right epididymis:  Normal in size and appearance. Left epididymis:  Normal in size and appearance. Hydrocele:  Small left hydrocele. Varicocele:  None visualized. Pulsed Doppler interrogation of both testes demonstrates normal low resistance arterial and venous waveforms bilaterally. Other: Hypoechoic subcutaneous lesion at the patient identified site of pain and palpable abnormality at the right aspect of the scrotum, measuring 2.3 x 0.7 x 2.2 cm. Overlying scrotal skin thickening.  IMPRESSION: 1. Hypoechoic subcutaneous lesion at the patient identified site of pain and palpable abnormality at the right aspect of the scrotum, measuring 2.3 x 0.7 x 2.2 cm, with overlying scrotal skin thickening. The appearance is most compatible with a small phlegmon, without clear evidence of discrete fluid collection at this time. 2. The bilateral testicles are normal in appearance with normal arterial and venous Doppler flow. 3. Small left hydrocele. Electronically Signed   By: Jearld Lesch M.D.   On: 05/12/2022 14:21    Procedures .Marland KitchenIncision and Drainage  Date/Time: 05/12/2022 4:59 PM  Performed by: Marrianne Mood, MD Authorized by: Gwyneth Sprout, MD   Consent:    Consent obtained:  Verbal   Consent given by:  Patient   Risks, benefits, and alternatives were discussed: yes     Risks discussed:  Bleeding, incomplete drainage, pain, damage to other organs and infection   Alternatives discussed:  Alternative treatment Location:    Type:  Abscess   Size:  2 cm   Location: groin. Pre-procedure details:    Skin preparation:  Antiseptic wash Anesthesia:    Anesthesia method:  Local infiltration   Local anesthetic:  Lidocaine 1% WITH epi Procedure type:    Complexity:  Simple Procedure details:    Ultrasound guidance: no     Needle aspiration: no     Incision types:  Stab incision   Incision depth:  Dermal   Wound management:  Probed and deloculated   Drainage:  Serosanguinous and purulent   Drainage amount:  Scant   Medications Ordered in ED Medications  lidocaine-EPINEPHrine (XYLOCAINE W/EPI) 1 %-1:100000 (with pres) injection 20 mL (has no administration in time range)  doxycycline (VIBRA-TABS) tablet 200 mg (has no administration in time range)    ED Course/ Medical Decision Making/ A&P Clinical Course as of 05/12/22 1732  Thu May 12, 2022  1609 Temp: 98.7 F (37.1 C) [JR]    Clinical Course User Index [JR] Gareth Eagle, PA-C                            Medical Decision Making Amount and/or Complexity of Data Reviewed Labs: ordered.  Risk Prescription drug management.   This patient is a 46 year old male presents with 2 days of a tender, draining, indurated lesion of the right groin consistent with abscess.  No systemic illness.  No involvement of nearby penis or testicles.  Ultrasound shows superficial phlegmon with small fluid collection I will incise and drain the abscess and treat the patient with a 7-day course of doxycycline.  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  The pertinent results include:   CBC, BMP, UA within normal limits   Imaging Studies ordered:  I ordered (or  co-signed) imaging studies including scrotal ultrasound  I independently visualized and interpreted imaging which showed normal testes and scrotum. Superficial abscess/phlegmon right groin. I agree with the radiologist interpretation.  Medicines ordered and prescription drug management:  I ordered medication including doxycycline 200 mg Reevaluation of the patient after these medicines showed that the patient stayed the same  Reevaluation:  After the interventions noted above, I reevaluated the patient and found that they have :improved.    Dispostion:  After consideration of the diagnostic results and the patients response to treatment, I feel that the patent would benefit from discharge home to complete a 7 day course of doxycycline.          Final Clinical Impression(s) / ED Diagnoses Final diagnoses:  Abscess    Rx / DC Orders ED Discharge Orders          Ordered    doxycycline (VIBRAMYCIN) 50 MG capsule  2 times daily        05/12/22 1730              Marrianne Mood, MD 05/12/22 5462    Gwyneth Sprout, MD 05/13/22 7035

## 2022-05-12 NOTE — Discharge Instructions (Signed)
Go to the emergency department as soon as you leave urgent care as I feel that you probably need an ultrasound of your testicle to ensure no more extensive involvement.

## 2022-05-12 NOTE — ED Triage Notes (Signed)
Patient with abscess to the groin area on the R side. Patient has had this since Monday and it popped on Tuesday. Tender to the touch per the patient.

## 2022-05-12 NOTE — ED Triage Notes (Signed)
Pt is present today with abscess on the right side of his groin. Pt states that he noticed it Monday and the abscess ruptured Tuesday

## 2022-05-12 NOTE — Discharge Instructions (Addendum)
You were evaluated in the emergency department for an abscess. Your abscess was incised and drained. You were treated with antibiotics. Please continue taking your antibiotics at home. Take 1 capsule in the morning and 1 capsule at night for 6 days. Return to the hospital for new or worsening swelling or fevers greater than 100.4 F.

## 2022-05-12 NOTE — ED Notes (Signed)
Patient is being discharged from the Urgent Care and sent to the Emergency Department via POV . Per Ervin Knack NP, patient is in need of higher level of care due to possible infection near the testicles. Patient is aware and verbalizes understanding of plan of care.  Vitals:   05/12/22 1046  BP: (!) 147/89  Pulse: 74  Resp: 18  Temp: 98.2 F (36.8 C)  SpO2: 97%

## 2023-03-17 ENCOUNTER — Ambulatory Visit (INDEPENDENT_AMBULATORY_CARE_PROVIDER_SITE_OTHER): Payer: BC Managed Care – PPO

## 2023-03-17 ENCOUNTER — Encounter (HOSPITAL_COMMUNITY): Payer: Self-pay

## 2023-03-17 ENCOUNTER — Ambulatory Visit (HOSPITAL_COMMUNITY)
Admission: EM | Admit: 2023-03-17 | Discharge: 2023-03-17 | Disposition: A | Discharge status: Home / Self Care | Payer: BC Managed Care – PPO | Attending: Internal Medicine | Admitting: Internal Medicine

## 2023-03-17 DIAGNOSIS — N200 Calculus of kidney: Secondary | ICD-10-CM

## 2023-03-17 DIAGNOSIS — R109 Unspecified abdominal pain: Secondary | ICD-10-CM

## 2023-03-17 LAB — POCT URINALYSIS DIP (MANUAL ENTRY)
Bilirubin, UA: NEGATIVE
Glucose, UA: NEGATIVE mg/dL
Ketones, POC UA: NEGATIVE mg/dL
Nitrite, UA: NEGATIVE
Protein Ur, POC: NEGATIVE mg/dL
Spec Grav, UA: 1.02 (ref 1.010–1.025)
Urobilinogen, UA: 0.2 E.U./dL
pH, UA: 7.5 (ref 5.0–8.0)

## 2023-03-17 MED ORDER — KETOROLAC TROMETHAMINE 30 MG/ML IJ SOLN
30.0000 mg | Freq: Once | INTRAMUSCULAR | Status: AC
  Administered 2023-03-17: 30 mg via INTRAMUSCULAR

## 2023-03-17 MED ORDER — KETOROLAC TROMETHAMINE 30 MG/ML IJ SOLN
INTRAMUSCULAR | Status: AC
  Filled 2023-03-17: qty 1

## 2023-03-17 MED ORDER — HYDROCODONE-ACETAMINOPHEN 5-325 MG PO TABS: 1.0000 | ORAL_TABLET | Freq: Four times a day (QID) | ORAL | 0 refills | Status: AC | PRN

## 2023-03-17 MED ORDER — KETOROLAC TROMETHAMINE 30 MG/ML IJ SOLN: 60.0000 mg | Freq: Once | INTRAMUSCULAR | Status: DC

## 2023-03-17 MED ORDER — TAMSULOSIN HCL 0.4 MG PO CAPS: 0.4000 mg | ORAL_CAPSULE | ORAL | 0 refills | Status: AC

## 2023-03-17 NOTE — Discharge Instructions (Signed)
I suspect you have a kidney stone that may have moved to your bladder, so strain it when you go to pee and save it to take it for testing to your doctor. If you get worse in 2-3 days, go to the ER to have cat scan done which is a better test

## 2023-03-17 NOTE — ED Triage Notes (Signed)
Patient here today with c/o LB pain and left side pain X 1 week. Pain radiates around to lower left side abd. He noticed that he has been having to urinate more frequently and having some stinging sensation at the end of his urinating. Appears dark in color this morning.

## 2023-03-17 NOTE — ED Provider Notes (Addendum)
[facility]    CSN: [id number] Arrival date & time: 03/17/23  1459      History   Chief Complaint Chief Complaint  Patient presents with   Flank Pain    HPI Jose Rangel is a 47 y.o. male who presents with L low back pain x 1 week which radiates to his L lower abdomen. Admits of mild dysuria and more frequency during urination. This am noticed his urine being darker yellow. Also when he first started urinating he noticed stringy blood matter come out. Denies penile discharge or concern of STD's. His pain kept him awake last night and there was no comfortable position he could get. He denies injuring himself at all. Denies lower extremity paresthesia Denies diarrhea or constipation. 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 Authorizing Provider  HYDROcodone-acetaminophen (NORCO/VICODIN) 5-325 MG tablet Take 1 tablet by mouth every 6 (six) hours as needed. 03/17/23  Yes Rodriguez-Southworth, Nettie Elm, PA-C  tamsulosin (FLOMAX) 0.4 MG CAPS capsule Take 1 capsule (0.4 mg total) by mouth 1 day or 1 dose for 10 doses. 03/17/23 03/27/23 Yes Rodriguez-Southworth, Nettie Elm, PA-C  EPINEPHrine 0.3 mg/0.3 mL IJ SOAJ injection Inject 0.3 mLs (0.3 mg total) into the muscle as needed (anaphylaxis). 05/24/20   Darr, Gerilyn Pilgrim, PA-C  fluticasone (FLONASE) 50 MCG/ACT nasal spray Place 2 sprays into both nostrils daily. 09/21/17   Muthersbaugh, Dahlia Client, PA-C    Family History Family History  Problem Relation Age of Onset   Hypertension Mother    Breast cancer Mother 57   Healthy Father     Social History Social History   Tobacco  Use   Smoking status: Former    Types: Cigarettes    Quit date: 2018    Years since quitting: 6.4   Smokeless tobacco: Never  Vaping Use   Vaping Use: Never used  Substance Use Topics   Alcohol use: Yes    Comment: occasional   Drug use: Yes    Types: Marijuana    Comment: occasional     Allergies   Bee venom   Review of Systems Review of Systems As noted in HPI  Physical Exam Triage Vital Signs ED Triage Vitals  Enc Vitals Group     BP 03/17/23 1604 130/86     Pulse Rate 03/17/23 1604 70     Resp 03/17/23 1604 18     Temp 03/17/23 1604 99.1 F (37.3 C)     Temp Source 03/17/23 1604 Oral     SpO2 03/17/23 1604 96 %     Weight 03/17/23 1604 145 lb (65.8 kg)     Height 03/17/23 1604 5\' 8"  (1.727 m)     Head Circumference --      Peak Flow --      Pain Score 03/17/23 1603 8     Pain Loc --      Pain Edu? --      Excl. in GC? --    No data found.  Updated Vital Signs BP 130/86 (BP Location: Right Arm)  Pulse 70   Temp 99.1 F (37.3 C) (Oral)   Resp 18   Ht 5\' 8"  (1.727 m)   Wt 145 lb (65.8 kg)   SpO2 96%   BMI 22.05 kg/m   Visual Acuity Right Eye Distance:   Left Eye Distance:   Bilateral Distance:    Right Eye Near:   Left Eye Near:    Bilateral Near:      Physical Exam Vitals and nursing note reviewed.  Constitutional:      General: She is not in acute distress.    Appearance: She is not toxic-appearing.  HENT:     Head: Normocephalic.     Right Ear: External ear normal.     Left Ear: External ear normal.  Eyes:     General: No scleral icterus.    Conjunctiva/sclera: Conjunctivae normal.  Pulmonary:     Effort: Pulmonary effort is normal.  Abdominal:     General: Bowel sounds are normal.     Palpations: Abdomen is soft. There is no mass.     Tenderness: There is no guarding or rebound.     Comments: + L CVA tenderness   Musculoskeletal:        General: Normal range of motion.     Cervical back: Neck supple.     Comments: BACK-  no muscular tenderness on area of complaint with palpation, but present with L lateral flexion and anterior flexion. Neg SLR Skin:    General: Skin is warm and dry.     Findings: No rash.  Neurological:     Mental Status: She is alert and oriented to person, place, and time. Normal reflexes    Gait: Gait normal.  Psychiatric:        Mood and Affect: Mood normal.        Behavior: Behavior normal.        Thought Content: Thought content normal.        Judgment: Judgment normal.    UC Treatments / Results  Labs (all labs ordered are listed, but only abnormal results are displayed) Labs Reviewed  POCT URINALYSIS DIP (MANUAL ENTRY) - Abnormal; Notable for the following components:      Result Value   Blood, UA trace-intact (*)    Leukocytes, UA Trace (*)    All other components within normal limits  POCT URINALYSIS DIP (MANUAL ENTRY)    EKG   Radiology DG Abd 1 View  Result Date: 03/17/2023 CLINICAL DATA:  L flank pain, blood in urine this am. EXAM: ABDOMEN - 1 VIEW COMPARISON:  None Available. FINDINGS: The bowel gas pattern is normal. Surgical clips project over the pelvis. No radio-opaque calculi or other significant radiographic abnormality are seen. IMPRESSION: No radiopaque calculi. Electronically Signed   By: Orvan Falconer M.D.   On: 03/17/2023 16:43    Procedures Procedures (including critical care time)  Medications Ordered in UC Medications  ketorolac (TORADOL) 30 MG/ML injection 30 mg (30 mg Intramuscular Given 03/17/23 1701)    Initial Impression / Assessment and Plan / UC Course  I have reviewed the triage vital signs and the nursing notes. He was given Toradol 30 mg IM and his pain improved from 7/10 to 6/10 within 15 minutes.  Pertinent labs & imaging results that were available during my care of the patient were reviewed by me and considered in my medical decision making (see chart for details).  I suspect he may have a renal stone   He was advised to  go  to ER to have CT done if his symptoms get worse.  He was placed on Flomax and oral Norco for pain for a few days.     Final Clinical Impressions(s) / UC Diagnoses   Final diagnoses:  Left flank pain  Kidney stone     Discharge Instructions      I suspect you have a kidney stone that may have moved to your bladder, so strain it when you go to pee and save it to take it for testing to your doctor. If you get worse in 2-3 days, go to the ER to have cat scan done which is a better test      ED Prescriptions     Medication Sig Dispense Auth. Provider   tamsulosin (FLOMAX) 0.4 MG CAPS capsule Take 1 capsule (0.4 mg total) by mouth 1 day or 1 dose for 10 doses. 10 capsule Rodriguez-Southworth, Nettie Elm, PA-C   HYDROcodone-acetaminophen (NORCO/VICODIN) 5-325 MG tablet Take 1 tablet by mouth every 6 (six) hours as needed. 10 tablet Rodriguez-Southworth, Nettie Elm, PA-C      I have reviewed the PDMP during this encounter.   Garey Ham, PA-C 03/17/23 1726    Rodriguez-Southworth, Vesper, PA-C 03/17/23 1728

## 2023-09-11 ENCOUNTER — Ambulatory Visit (INDEPENDENT_AMBULATORY_CARE_PROVIDER_SITE_OTHER): Payer: BC Managed Care – PPO

## 2023-09-11 ENCOUNTER — Encounter (HOSPITAL_COMMUNITY): Payer: Self-pay | Admitting: Emergency Medicine

## 2023-09-11 ENCOUNTER — Ambulatory Visit (HOSPITAL_COMMUNITY)
Admission: EM | Admit: 2023-09-11 | Discharge: 2023-09-11 | Disposition: A | Discharge status: Home / Self Care | Payer: BC Managed Care – PPO | Attending: Physician Assistant | Admitting: Physician Assistant

## 2023-09-11 DIAGNOSIS — M25551 Pain in right hip: Secondary | ICD-10-CM

## 2023-09-11 MED ORDER — KETOROLAC TROMETHAMINE 30 MG/ML IJ SOLN
INTRAMUSCULAR | Status: AC
  Filled 2023-09-11: qty 1

## 2023-09-11 MED ORDER — PREDNISONE 20 MG PO TABS: 40.0000 mg | ORAL_TABLET | Freq: Every day | ORAL | 0 refills | Status: AC

## 2023-09-11 MED ORDER — KETOROLAC TROMETHAMINE 30 MG/ML IJ SOLN
30.0000 mg | Freq: Once | INTRAMUSCULAR | Status: AC
  Administered 2023-09-11: 30 mg via INTRAMUSCULAR

## 2023-09-11 MED ORDER — CYCLOBENZAPRINE HCL 10 MG PO TABS: 10.0000 mg | ORAL_TABLET | Freq: Two times a day (BID) | ORAL | 0 refills | Status: AC | PRN

## 2023-09-11 NOTE — Discharge Instructions (Addendum)
Take prednisone as prescribed Can take Flexeril as needed for muscle spasm Take Tylenol as needed Avoid NSAIDS like Aleve and Ibuprofen while taking Prednisone.  Recommend ice, gentle stretching  If no improvement follow up with orthopedics

## 2023-09-11 NOTE — ED Provider Notes (Signed)
[facility]    CSN: [id number] Arrival date & time: 09/11/23  1505      History   Chief Complaint Chief Complaint  Patient presents with   Hip Pain    HPI Jose Rangel is a 47 y.o. male.   Pt presents with right hip pain that started yesterday and became worse today.  He denies injury or trauma. He reports some pain in the groin, denies testicle pain.  Pain is worse with movement.  He has tried nothing for the sx. He has never experienced similar sx. Denies radiation of pain, numbness, tingling.     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 Authorizing Provider  cyclobenzaprine (FLEXERIL) 10 MG tablet Take 1 tablet (10 mg total) by mouth 2 (two) times daily as needed for muscle spasms. 09/11/23  Yes Ward, Tylene Fantasia, PA-C  predniSONE (DELTASONE) 20 MG tablet Take 2 tablets (40 mg total) by mouth daily for 5 days. 09/11/23 09/16/23 Yes Ward, Tylene Fantasia, PA-C  EPINEPHrine 0.3 mg/0.3 mL IJ SOAJ injection Inject 0.3 mLs (0.3 mg total) into the muscle as needed (anaphylaxis). 05/24/20   Darr, Gerilyn Pilgrim, PA-C  fluticasone (FLONASE) 50 MCG/ACT nasal spray Place 2 sprays into both nostrils daily. 09/21/17   Muthersbaugh, Dahlia Client, PA-C  HYDROcodone-acetaminophen (NORCO/VICODIN) 5-325 MG tablet Take 1 tablet by mouth every 6 (six) hours as needed. Patient not taking: Reported on 09/11/2023 03/17/23   Rodriguez-Southworth, Nettie Elm, PA-C    Family History Family History  Problem Relation Age of Onset   Hypertension Mother    Breast cancer Mother 48   Healthy Father     Social History Social History   Tobacco Use   Smoking status: Former     Current packs/day: 0.00    Types: Cigarettes    Quit date: 2018    Years since quitting: 6.9   Smokeless tobacco: Never  Vaping Use   Vaping status: Never Used  Substance Use Topics   Alcohol use: Yes    Comment: occasional   Drug use: Yes    Types: Marijuana    Comment: occasional     Allergies   Bee venom   Review of Systems Review of Systems  Constitutional:  Negative for chills and fever.  HENT:  Negative for ear pain and sore throat.   Eyes:  Negative for pain and visual disturbance.  Respiratory:  Negative for cough and shortness of breath.   Cardiovascular:  Negative for chest pain and palpitations.  Gastrointestinal:  Negative for abdominal pain and vomiting.  Genitourinary:  Negative for dysuria and hematuria.  Musculoskeletal:  Positive for arthralgias (right hip pain). Negative for back pain.  Skin:  Negative for color change and rash.  Neurological:  Negative for seizures and syncope.  All other systems reviewed and are negative.    Physical Exam Triage Vital Signs ED Triage Vitals [09/11/23 1601]  Encounter Vitals Group     BP 134/84     Systolic BP Percentile      Diastolic BP Percentile      Pulse Rate 92  Resp 16     Temp 98.4 F (36.9 C)     Temp Source Oral     SpO2 94 %     Weight      Height      Head Circumference      Peak Flow      Pain Score 9     Pain Loc      Pain Education      Exclude from Growth Chart    No data found.  Updated Vital Signs BP 134/84 (BP Location: Right Arm)   Pulse 92   Temp 98.4 F (36.9 C) (Oral)   Resp 16   SpO2 94%   Visual Acuity Right Eye Distance:   Left Eye Distance:   Bilateral Distance:    Right Eye Near:   Left Eye Near:    Bilateral Near:     Physical Exam Vitals and nursing note reviewed.  Constitutional:      General: He is not in acute distress.    Appearance: He is well-developed.  HENT:     Head: Normocephalic and atraumatic.  Eyes:     Conjunctiva/sclera:  Conjunctivae normal.  Cardiovascular:     Rate and Rhythm: Normal rate and regular rhythm.     Heart sounds: No murmur heard. Pulmonary:     Effort: Pulmonary effort is normal. No respiratory distress.     Breath sounds: Normal breath sounds.  Abdominal:     Palpations: Abdomen is soft.     Tenderness: There is no abdominal tenderness.  Musculoskeletal:        General: No swelling.     Cervical back: Neck supple.     Comments: Pain with internal and external rotation of right hip, no TTP over greater troch bursa.  Normal strength and sensation.  Negative SLR.   Skin:    General: Skin is warm and dry.     Capillary Refill: Capillary refill takes less than 2 seconds.  Neurological:     Mental Status: He is alert.  Psychiatric:        Mood and Affect: Mood normal.      UC Treatments / Results  Labs (all labs ordered are listed, but only abnormal results are displayed) Labs Reviewed - No data to display  EKG   Radiology No results found.  Procedures Procedures (including critical care time)  Medications Ordered in UC Medications  ketorolac (TORADOL) 30 MG/ML injection 30 mg (30 mg Intramuscular Given 09/11/23 1647)    Initial Impression / Assessment and Plan / UC Course  I have reviewed the triage vital signs and the nursing notes.  Pertinent labs & imaging results that were available during my care of the patient were reviewed by me and considered in my medical decision making (see chart for details).     Right hip pain, imaging negative for fracture.  Will treat with muscle relaxer and prednisone.  If no improvement advised to follow up with ortho.  Final Clinical Impressions(s) / UC Diagnoses   Final diagnoses:  Right hip pain     Discharge Instructions      Take prednisone as prescribed Can take Flexeril as needed for muscle spasm Take Tylenol as needed Avoid NSAIDS like Aleve and Ibuprofen while taking Prednisone.  Recommend ice, gentle stretching   If no improvement follow up with orthopedics    ED Prescriptions     Medication Sig Dispense Auth. Provider   cyclobenzaprine (FLEXERIL) 10 MG tablet Take 1 tablet (10  mg total) by mouth 2 (two) times daily as needed for muscle spasms. 20 tablet Ward, Tylene Fantasia, PA-C   predniSONE (DELTASONE) 20 MG tablet Take 2 tablets (40 mg total) by mouth daily for 5 days. 10 tablet Ward, Tylene Fantasia, PA-C      PDMP not reviewed this encounter.   Ward, Tylene Fantasia, PA-C 09/11/23 [phone]

## 2023-09-11 NOTE — ED Triage Notes (Addendum)
Pt c/o right hip pain for 1day. States feels like right side is going to buckle.
